# Patient Record
Sex: Male | Born: 1990 | Race: Black or African American | Hispanic: No | Marital: Married | Smoking: Current every day smoker
Health system: Southern US, Community
[De-identification: ages and names within clinical notes are randomized; demographics above are authoritative.]

## PROBLEM LIST (undated history)

## (undated) DIAGNOSIS — Y249XXA Unspecified firearm discharge, undetermined intent, initial encounter: Secondary | ICD-10-CM

## (undated) DIAGNOSIS — G610 Guillain-Barre syndrome: Secondary | ICD-10-CM

---
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
## 2008-10-26 IMAGING — CT CT HEAD W/O CM
1 series · 16 of 30 positions shown, 20 images · IV contrast (agent unspecified)
Comparison: none

HISTORY: Headache

CT HEAD WITHOUT CONTRAST:
Routine noncontrast CT head compared to 11/09/2006
Normal ventricular morphology.
No midline shift or mass-effect.
Normal appearance of brain parenchyma.
No mass, hemorrhage, or infarct.
Mucosal thickening left maxillary sinus.
Remaining sinuses clear bones unremarkable.

[Series 2: headseq 4.8 h37s · axial · 0.46mm/px · z∈[+93,+228]mm · 16 of 30 slices shown, 20 images]
[im 2/30  brain]
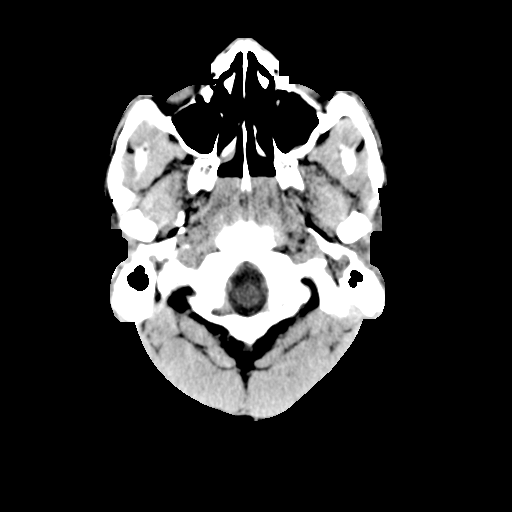
[im 2/30  bone]
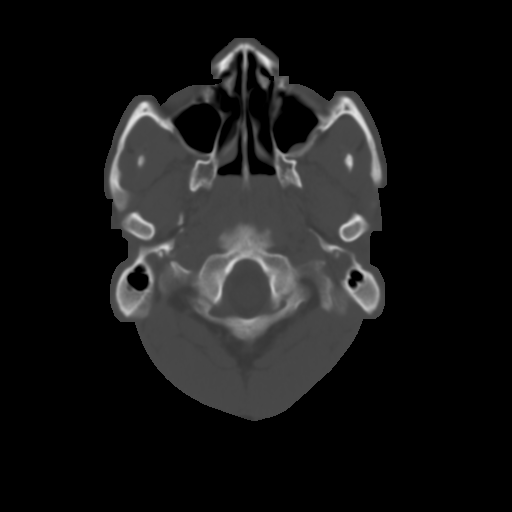
[im 4/30  brain]
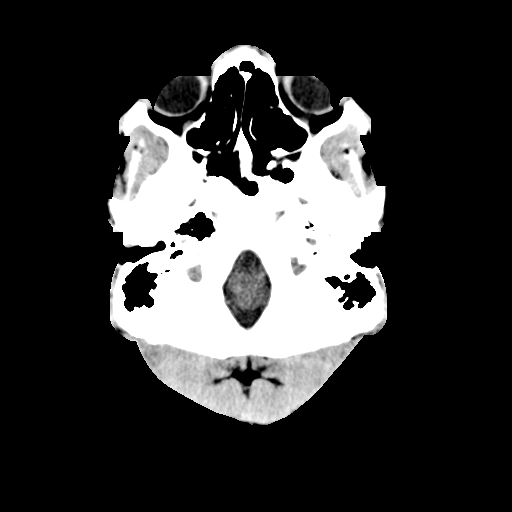
[im 6/30  brain]
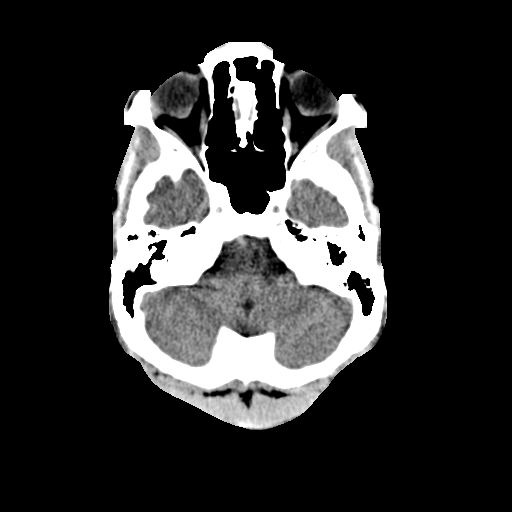
[im 8/30  brain]
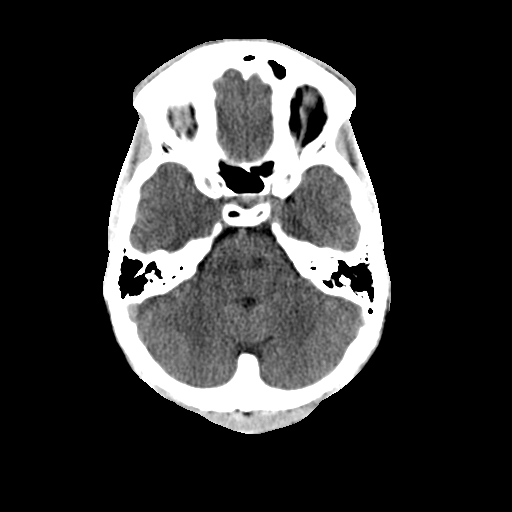
[im 9/30  brain]
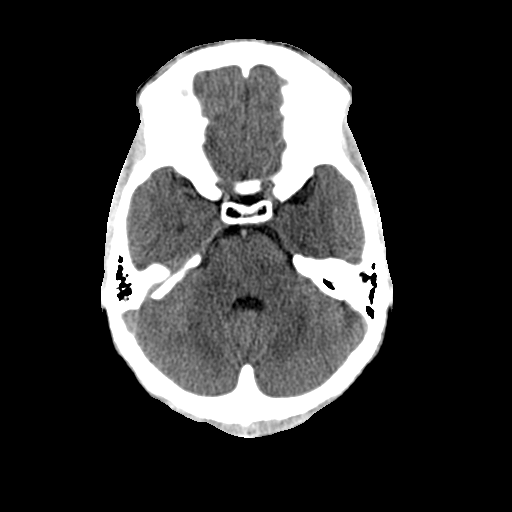
[im 9/30  bone]
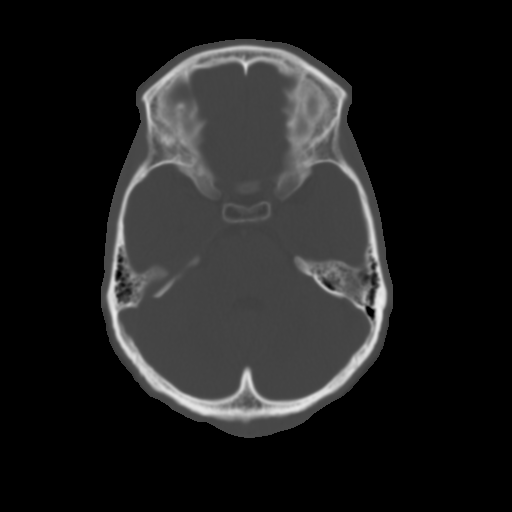
[im 11/30  brain]
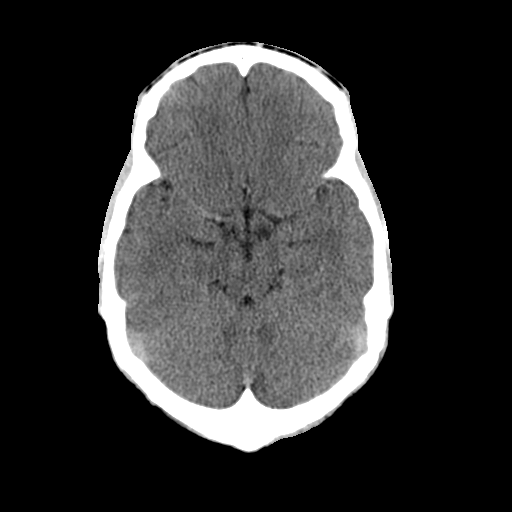
[im 13/30  brain]
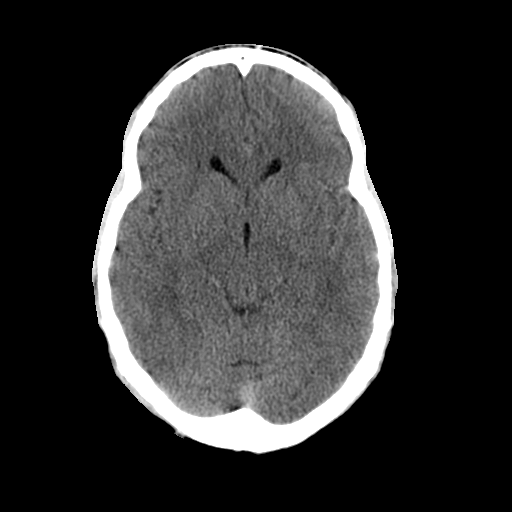
[im 15/30  brain]
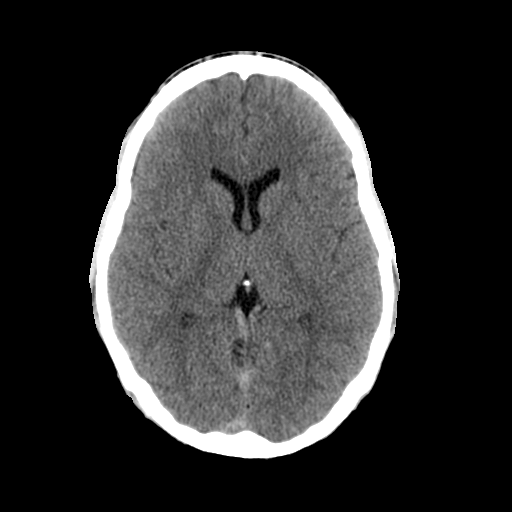
[im 16/30  brain]
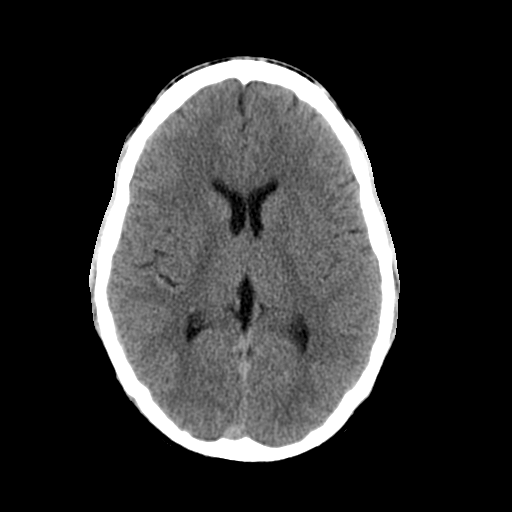
[im 16/30  bone]
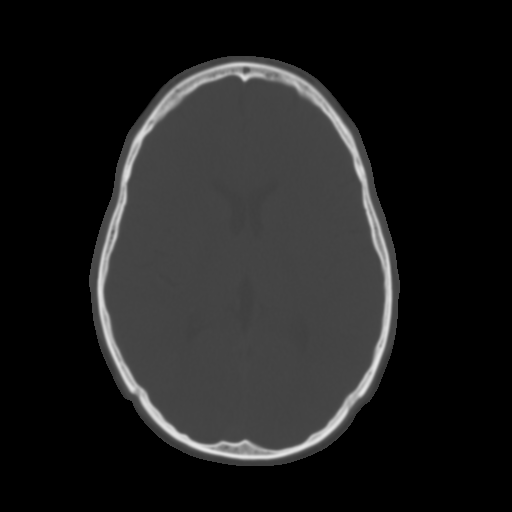
[im 18/30  brain]
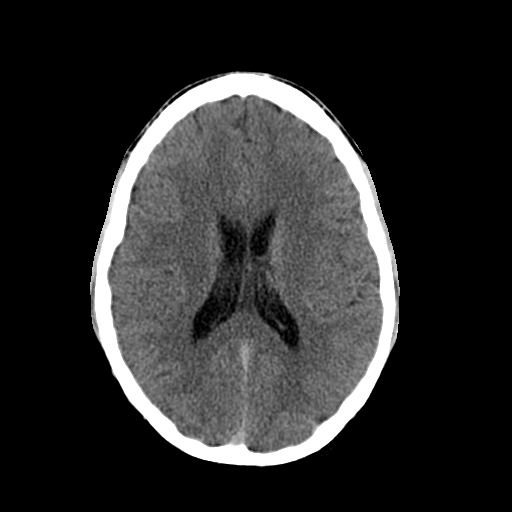
[im 20/30  brain]
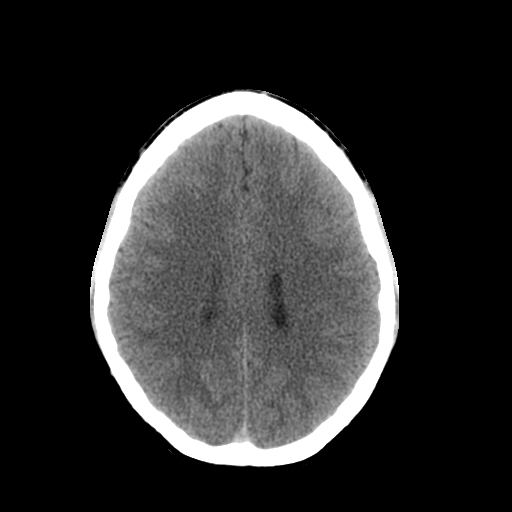
[im 22/30  brain]
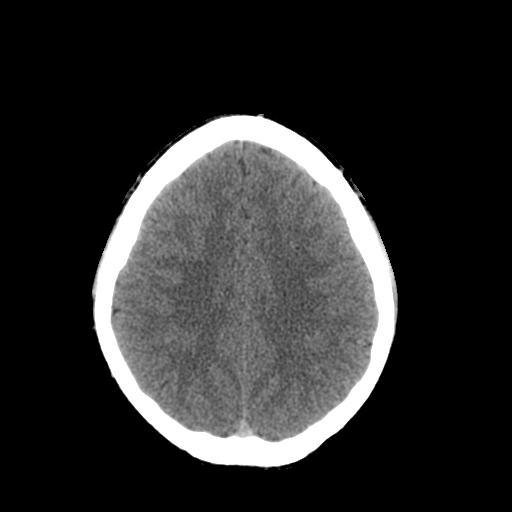
[im 23/30  brain]
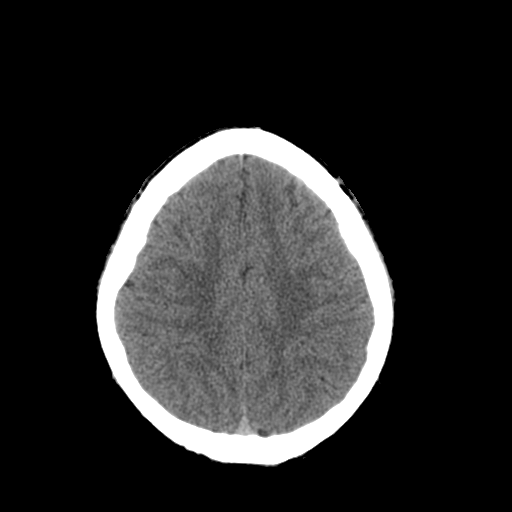
[im 23/30  bone]
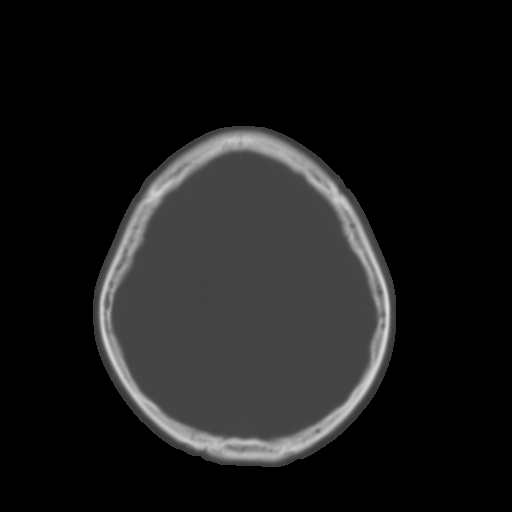
[im 25/30  brain]
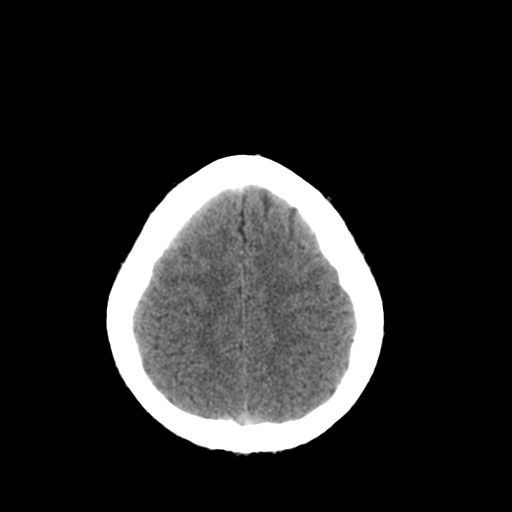
[im 27/30  brain]
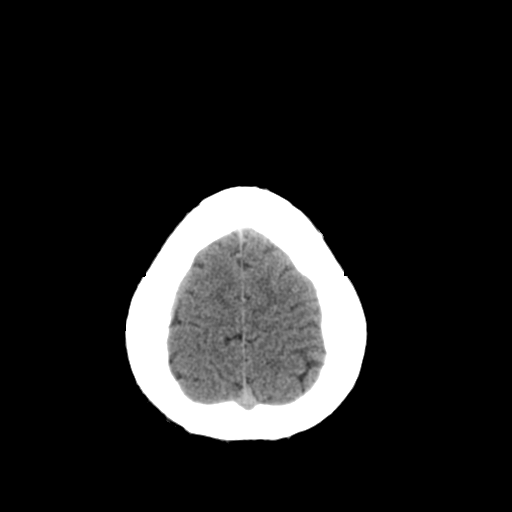
[im 29/30  brain]
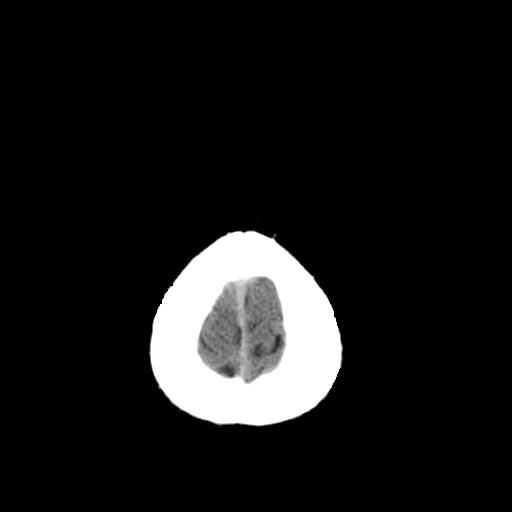

[16 of 30 positions shown; findings below may reference images not displayed]

IMPRESSION: No acute intracranial abnormalities.
If patient's symptoms persist, recommend followup MRI brain to evaluate.

## 2010-02-13 ENCOUNTER — Emergency Department (HOSPITAL_COMMUNITY)
Admission: EM | Admit: 2010-02-13 | Discharge: 2010-02-13 | Disposition: A | Discharge status: Home / Self Care | Payer: Self-pay | Attending: Emergency Medicine | Admitting: Emergency Medicine

## 2010-02-13 ENCOUNTER — Emergency Department (HOSPITAL_COMMUNITY): Admit: 2010-02-13 | Discharge: 2010-02-13 | Disposition: A | Discharge status: Home / Self Care | Payer: Self-pay

## 2010-02-13 DIAGNOSIS — S0180XA Unspecified open wound of other part of head, initial encounter: Secondary | ICD-10-CM | POA: Insufficient documentation

## 2010-02-13 DIAGNOSIS — T07XXXA Unspecified multiple injuries, initial encounter: Secondary | ICD-10-CM | POA: Insufficient documentation

## 2010-02-13 DIAGNOSIS — S022XXA Fracture of nasal bones, initial encounter for closed fracture: Secondary | ICD-10-CM | POA: Insufficient documentation

## 2010-02-13 DIAGNOSIS — M542 Cervicalgia: Secondary | ICD-10-CM | POA: Insufficient documentation

## 2010-02-13 DIAGNOSIS — Y92410 Unspecified street and highway as the place of occurrence of the external cause: Secondary | ICD-10-CM | POA: Insufficient documentation

## 2010-02-14 ENCOUNTER — Emergency Department (HOSPITAL_COMMUNITY)
Admission: EM | Admit: 2010-02-14 | Discharge: 2010-02-14 | Disposition: A | Discharge status: Home / Self Care | Payer: Self-pay | Attending: Emergency Medicine | Admitting: Emergency Medicine

## 2010-02-14 DIAGNOSIS — Y929 Unspecified place or not applicable: Secondary | ICD-10-CM | POA: Insufficient documentation

## 2010-02-14 DIAGNOSIS — M545 Low back pain, unspecified: Secondary | ICD-10-CM | POA: Insufficient documentation

## 2010-02-14 DIAGNOSIS — T148XXA Other injury of unspecified body region, initial encounter: Secondary | ICD-10-CM | POA: Insufficient documentation

## 2010-02-14 DIAGNOSIS — M25519 Pain in unspecified shoulder: Secondary | ICD-10-CM | POA: Insufficient documentation

## 2010-02-14 DIAGNOSIS — G610 Guillain-Barre syndrome: Secondary | ICD-10-CM | POA: Insufficient documentation

## 2010-02-14 DIAGNOSIS — J45909 Unspecified asthma, uncomplicated: Secondary | ICD-10-CM | POA: Insufficient documentation

## 2010-05-27 NOTE — H&P (Signed)
NAMEIBRAHIMA, William Bartlett               ACCOUNT NO.:  [id number]   MEDICAL RECORD NO.:  [id number]          PATIENT TYPE:  INP   LOCATION:  A332                          FACILITY:  [facility]   PHYSICIAN:  Gardiner Barefoot, MD    DATE OF BIRTH:  [birth year]   DATE OF ADMISSION:  02/25/2007  DATE OF DISCHARGE:  LH                              HISTORY & PHYSICAL   CHIEF COMPLAINT:  Weakness.   HISTORY OF PRESENT ILLNESS:  This is an 20 year old male who comes in  with 3 days of progressive weakness, starting from the lower  extremities.  He continued to have difficulty walking, some fatigue, and  dysarthria.  The patient recently had what was thought to be a sinusitis  and recently has been on azithromycin as well as doxycycline.  He was  seen in the emergency room by neurology, who does feel the patient has a  Guillain-Barre syndrome.   PAST MEDICAL HISTORY:  1. Bronchitis.  2. Eczema.   MEDICATIONS:  Vicodin p.r.n. for recent headache.   ALLERGIES:  AZITHROMYCIN causes lip swelling.   SOCIAL HISTORY:  Admits to smoking, denies drinking, occasional  marijuana.   FAMILY HISTORY:  Hypertension.   REVIEW OF SYSTEMS:  Negative except for history of present illness.   PHYSICAL EXAMINATION:  VITAL SIGNS:  Temperature is 98.7, pulse is 94,  respirations 15, blood pressure is 136/83, and O2 sat is 99%.  GENERAL:  The patient is awake, alert, and oriented x3, appears in no  acute distress.  CARDIOVASCULAR:  Regular rate and rhythm.  No murmurs, rubs, or gallops.  LUNGS:  Clear to auscultation bilaterally.  ABDOMEN:  Soft, nontender, nondistended.  Positive bowel sounds.  No  hepatosplenomegaly.  EXTREMITIES:  No cyanosis, clubbing, or edema.  NEUROLOGIC:  Weakness lower extremities.  He is able to move them.  Dysarthria.   No labs.   ASSESSMENT/PLAN:  Guillian-Barre.  Appreciate neurology input.  Neurology is starting him on immunoglobulin therapy as well as doing an  extensive workup  including an MRI and blood tests.  The patient is going  to get a swallow evaluation and physical therapy and occupational  therapy.      Gardiner Barefoot, MD  Electronically Signed     RWC/MEDQ  D:  02/25/2007  T:  02/25/2007  Job:  [id number]

## 2010-05-27 NOTE — Group Therapy Note (Signed)
NAMEKAMIR, SELOVER NO.:  [id number]   MEDICAL RECORD NO.:  [id number]          PATIENT TYPE:  INP   LOCATION:  A332                          FACILITY:  [facility]   PHYSICIAN:  Gardiner Barefoot, MD    DATE OF BIRTH:  25-Jul-1990   DATE OF PROCEDURE:  DATE OF DISCHARGE:                                 PROGRESS NOTE   DICTATION ENDED AT THIS POINT      Gardiner Barefoot, MD  Electronically Signed     RWC/MEDQ  D:  02/26/2007  T:  02/27/2007  Job:  [id number]

## 2010-05-27 NOTE — Group Therapy Note (Signed)
William Bartlett, William Bartlett               ACCOUNT NO.:  [id number]   MEDICAL RECORD NO.:  [id number]          PATIENT TYPE:  INP   LOCATION:  A332                          FACILITY:  [facility]   PHYSICIAN:  Gardiner Barefoot, MD    DATE OF BIRTH:  [birth year]   DATE OF PROCEDURE:  02/26/2007  DATE OF DISCHARGE:                                 PROGRESS NOTE   SUBJECTIVE:  The patient reports some mild improvement in symptoms and  is able to walk around.   OBJECTIVE:  VITAL SIGNS:  Temperature is 98.1, pulse is 105,  respirations 20, blood pressure 132/72.  GENERAL:  The patient is awake and alert and appears in no acute  distress.  CARDIOVASCULAR:  Tachycardic with regular rhythm.  No murmurs, rubs or  gallops.  LUNGS:  Clear to auscultation bilaterally.  ABDOMEN:  Soft, nontender, nondistended, positive bowel sounds, no  hepatosplenomegaly.  EXTREMITIES:  No cyanosis, clubbing or edema.  NEUROLOGIC:  The patient is able to ambulate with assistance although is  unsteady.  He does move all extremities.  He is alert.   Laboratory data:  Sedimentation rate is 7.  TSH is 1.25, free T4 is  1.22.  B12 is within normal limits.  CBC and CMP within normal limits.  MRI with no acute intracranial findings.  Some minimal left mastoid  effusion and mucosa thickening of the left maxillary sinus.   ASSESSMENT AND PLAN:  An 20 year old with Guillain-Barre syndrome.   Neurologic:  The patient's symptoms are consistent with a degree of  Guillain-Barre, and we will continue to monitor the patient.  He is  receiving IVIG per neurology.  Had some mild improvement since  yesterday.  Appreciate neurology input.  We will continue to monitor and  ambulate and have physical therapy work with him.  He is scheduled to  get a swallow evaluation.      Gardiner Barefoot, MD  Electronically Signed     RWC/MEDQ  D:  02/26/2007  T:  02/27/2007  Job:  [id number]

## 2010-05-27 NOTE — Discharge Summary (Signed)
William Bartlett, William Bartlett               ACCOUNT NO.:  [id number]   MEDICAL RECORD NO.:  [id number]          PATIENT TYPE:  INP   LOCATION:  A332                          FACILITY:  [facility]   PHYSICIAN:  Skeet Latch, DO    DATE OF BIRTH:  1990-02-03   DATE OF ADMISSION:  02/25/2007  DATE OF DISCHARGE:  LH                               DISCHARGE SUMMARY   ADMISSION DIAGNOSIS:  Guillain-Barre syndrome, weakness.   DISCHARGE DIAGNOSIS:  Guillain-Barre syndrome, weakness.   HISTORY OF PRESENT ILLNESS:  Please see H and P done on February 25, 2007 for details.  This is an 20 year old African American male who  presented to the ER with headache and sinus symptoms.  He was diagnosed  with sinusitis and started on Zithromax and proceeded to develop facial  swelling, therefore his medication was discontinued.  He was then  started on Doxycycline and continued to have worsening symptoms, began  to have difficulty walking, talking and eating.  He went to the  emergency room for evaluation.  He continued to have headaches.  He was  having extreme difficulty walking, stumbling and developed dysarthria,  problems swallowing.  He was seen and admitted for a swallowing  evaluation.  He was kept NPO.  He had a head CT performed that showed no  intracranial abnormalities.  The patient did have a CT of his head done  on February 18, 2007 that showed no acute intracranial abnormalities.  Subsequent MR of his brain was performed which showed prominent adenoid  tissue, showed minimal mucosal thickening of left maxillary sinus, also  showed tiny left mastoid effusion.  Upon being admitted to the hospital  patient had a neurologic consult and it was believe that patient had  Guillain-Barre syndrome.  This was secondary to his gait weakness,  areflexia, dysarthria, dysphagia and facial plegia.  Patient was placed  on DVT and PE prophylaxis.  Patient was started on IV immunoglobulins.  Patient received  immunoglobulins for approximately five days.  Patient  continued to have physical therapy on a daily basis while in the  hospital.  Patient slowly improved but still had muscle weakness that  required inpatient treatment at this time.  His vitals on discharge:  Temperature 98.3, pulse 99, respirations 16, blood pressure 129/72.  Labs:  Showed sodium 135, potassium 3.6, chloride 101, CO 29, glucose  94, BUN 7, creatinine 0.79, white count 6.9, hemoglobin 14.6, hematocrit  41.6, platelet count 273.   DISCHARGE MEDICATIONS:  None.   CONDITION ON DISCHARGE:  Stable.   DISPOSITION:  Patient will be discharged to [facility] inpatient rehab  facility.   DISCHARGE INSTRUCTIONS:  Patient to have a soft diet with thin liquids.  Activity is to be addressed in patient rehab facility according to  symptoms.  Patient is to have a followup with his primary care physician  after he is discharged from the rehab facility.      Skeet Latch, DO  Electronically Signed     SM/MEDQ  D:  03/03/2007  T:  03/03/2007  Job:  [phone]

## 2010-05-27 NOTE — Assessment & Plan Note (Signed)
William Bartlett returns to the clinic today accompanied by his mother.  The  patient is an 20 year old male with an unremarkable past medical history  apart from recent sinus infection, for which he was placed on Zithromax.  He developed facial swelling and it was felt that he may have had an  antibiotic reaction and the antibiotic was changed to doxycycline but he  still had progressive facial swelling along with progressive weakness,  slurred speech and gait disorder.  He was admitted to [facility] February 25, 2007 with suspected Guillain-Barre' syndrome.  Cranial CT scan was negative.  MRI study was negative for acute changes.  He was started on intravenous immunoglobulin for five days.  He managed  to be stabilized and was moved to the rehabilitation unit at [facility] March 04, 2007.  He remained there through discharge  March 11, 2007.   Since discharge, the patient has been at home.  He had been receiving  home health therapies but that has discontinued at this point.  He is  due to followup with Dr. Gerilyn Pilgrim, his neurologist, for evaluation of  chronic migraines.  He continues to use oxycodone at the present time,  prescribed through this office.   The patient has not returned to school where he is an 11th grader.  He  previously was on suspension but the suspension was about to end at the  time that he came down with Guillain-Barre' syndrome.  He has not  returned to the regular school at [employer] at this point.   REVIEW OF SYSTEMS:  Noncontributory.   MEDICATIONS:  Oxycodone 5/325 one tablet t.i.d. p.r.n.   PHYSICAL EXAMINATION:  GENERAL:  Well-appearing, fit, adult male in no  acute discomfort.  VITAL SIGNS:  Blood pressure 127/67 with pulse of 84, respiratory rate  18 and oxygen saturation 100% on room air.   He has 5/5 strength throughout the bilateral upper and lower  extremities.  Bulk and tone are normal.  Reflexes are 2+ and  symmetrical.   Examination of the facial strength shows slightly weak eye  closure, especially on the right.  Extraocular muscles were intact but  with slight facial weakness on the right while smiling.  Tongue was in  the midline.  Sensation was intact to light touch throughout the  bilateral face.   IMPRESSION:  1. Status post Guillain-Barre' syndrome.  2. Dysarthria and dysphasia, resolved.  3. History of tobacco abuse.  4. History of migraine headaches, presently on oxycodone.   In the office today we did not need to refill his pain medicines.  If he  continues to need that pain medicine we will need to see him in followup  in approximately 2 to 4 month's time.  In the meantime, he will be  following up with his neurologist, Dr. Gerilyn Pilgrim for evaluation of his  chronic migraine headaches.  We will plan on seeing him in followup on  an as-needed basis and on a periodic basis if he continues to need  narcotics through this office.  At this point, the patient can return to  his regular schooling without modifications or restrictions.  His Mother  will be in charge of trying to get him back into the [place] school  district.           ______________________________  Ellwood Dense, M.D.    DC/MedQ  D:  04/20/2007 10:08:05  T:  04/20/2007 11:12:43  Job #:  [id number]

## 2010-05-27 NOTE — H&P (Signed)
NAMEEMMITTE, SURGEON NO.:  [id number]   MEDICAL RECORD NO.:  [id number]          PATIENT TYPE:  IPS   LOCATION:  4147                         FACILITY:  [facility]   PHYSICIAN:  Ellwood Dense, M.D.   DATE OF BIRTH:  12/27/1990   DATE OF ADMISSION:  03/04/2007  DATE OF DISCHARGE:                              HISTORY & PHYSICAL   PRIMARY CARE PHYSICIAN:  Skeet Latch, DO, at [facility]   HISTORY OF PRESENT ILLNESS:  Mr. William Bartlett is an 20 year old previously-  healthy African American male with a with an unremarkable past medical  history.  He does have a history of recent sinus infection and was  placed on Zithromax.  Initially the patient developed facial swelling  and it was felt to be a reaction to the Zithromax, and that was changed  to doxycycline.  His facial swelling and weakness and dysarthria  worsened along with a gait disorder.  He was admitted to [facility] February 25, 2007, was suspected Guillain-Barre  syndrome.  Initial cranial CT was negative and MRI study was negative  except for minimal mucosal thickening of the left maxillary sinus  consistent with sinusitis.   The patient was started on a 5-day course of IV immunoglobulin.  He was  placed on subcu Lovenox for DVT prophylaxis.  A speech therapy  evaluation showed that he could tolerate D3 with thin liquids.  His  mobility has continued to a improved and he needs min to mod assist for  gait at the present time.   The patient was evaluated by the rehabilitation physicians and felt to  be an appropriate candidate for inpatient rehabilitation.   REVIEW OF SYSTEMS:  Positive for headache.   PAST MEDICAL HISTORY:  1. Bronchitis.  2. Eczema.   FAMILY HISTORY:  Positive for hypertension.   SOCIAL HISTORY:  The patient lives with his mother, who works outside  the home.  The patient was recently suspended from high school in  [place] but was in the process  of trying to return to school.  His  family can assist as needed.  He has a history of tobacco use but denies  alcohol usage.  His does have a positive history for marijuana usage.   SECTION FUNCTIONAL HISTORY PRIOR TO ADMISSION:  Independent and  attending school until just recently.   ALLERGIES:  No known drug allergies.   MEDICATIONS PRIOR TO ADMISSION:  Vicodin p.r.n. for headaches.   LABORATORY:  Recent hemoglobin was 14.6 with hematocrit 41.0, platelet  count 263,000, white count of 6.9.  Recent sodium was 135, potassium  3.6, chloride 101, bicarbonate 29, BUN 7, creatinine 0.7, with TSH 1.258  and erythrocyte sedimentation rate of 7.   PHYSICAL EXAM:  He is a well-appearing adult teen lying in bed with his  mother present and uncle.  Blood pressure is 107/59 with a pulse of 103, respiratory rate 18 and  temperature 98.0.  HEENT: Normocephalic, nontraumatic.  CARDIOVASCULAR:  Regular rate and rhythm, S1 and S2 without murmurs.  ABDOMEN:  Soft, nontender, with positive bowel sounds.  LUNGS:  Clear to auscultation bilaterally.  NEUROLOGIC:  Alert and oriented x3.  Cranial nerve exam showed decreased  facial strength bilaterally.  Tongue was in the midline and he was able  to protrude the tongue but he had poor articulation of his words and  poor chewing inability.  Sensation was decreased to light touch on the  bilateral face.  The upper extremity exam showed 4+/5 strength throughout.  Bulk and tone  were normal.  The patient did complain of tingling of his bilateral  upper extremities.  Lower extremity exam showed hip flexion, knee  extension and ankle dorsiflexion at 3+ to 4-/5.  Sensation was decreased  to light touch in his distal lower extremities with complaints of  tingling in his ankles and feet.   IMPRESSION:  1. Guillain-Barre syndrome, status post IVIG for 5 days.  2. Dysarthria and dysphagia related to #1.   Presently the patient has deficits in ADLs, transfers  and ambulation  along with swallowing and speech related to the above-noted Guillain-  Barre syndrome.   PLAN:  1. Admit to rehabilitation with daily therapies to include physical      therapy for range of motion, strengthening, bed mobility,      transfers, pre gait training, gait training and equipment      evaluation.  2. Occupational therapy for range of motion, strengthening, ADLs,      cognitive/perceptual training, splinting and equipment evaluation.  3. Rehab nursing for skin care, wound care and bowel and bladder      training as necessary.  4. Speech therapy for oral motor exercises, communication aids, Vital      Stim, and evaluation of swallow.  5. Case management to assess home environment, assist with discharge      planning and arrange for appropriate follow-up care.  6. Social work to assess family and social support, consultation      regarding disability issues and assist in discharge planning.  7. Continue D3 thin liquid diet.  8. Check admission labs including CBC and CMET in a.m. March 07, 2007.  9. Thigh-high TEDS on q.a.m. and remove at nightly.  10.Oxycodone 5 mg one to two tablets q.4h. p.r.n. for pain.  11.Sorbitol 30 mL p.o. q.12 h. p.r.n.  12.Dulcolax suppository one per rectum daily p.r.n.  13.Routine turning to prevent skin breakdown.  14.Subcutaneous Lovenox 40 mg daily.  15.Senokot-S 2 tablets p.o. nightly.  16.Discontinue IV fluids if not already done.  17.Multivitamin one p.o. daily.  18.Eye patches or eye mask on at bedtime to eliminate corneal      abrasions.  19.Normal saline eye drops used p.r.n.   PROGNOSIS:  Good.   ESTIMATED LENGTH OF STAY:  Approximately 2-3 weeks.   GOALS:  Modified independent, ADLs; and standby assist, transfers and  ambulation; and modified independent, wheelchair mobility, with  advancement of diet as possible.           ______________________________  Ellwood Dense, M.D.     DC/MEDQ  D:   03/04/2007  T:  03/05/2007  Job:  [id number]

## 2010-05-27 NOTE — Discharge Summary (Signed)
William Bartlett, William Bartlett NO.:  [id number]   MEDICAL RECORD NO.:  [id number]          PATIENT TYPE:  IPS   LOCATION:  4147                         FACILITY:  [facility]   PHYSICIAN:  Ellwood Dense, M.D.   DATE OF BIRTH:  1990/03/01   DATE OF ADMISSION:  03/04/2007  DATE OF DISCHARGE:  03/11/2007                               DISCHARGE SUMMARY   DISCHARGE DIAGNOSES:  1. Guillain-Barr syndrome.  2. Dysarthria.  3. Dysphagia, resolved.  4. History of tobacco abuse.  5. Subcutaneous Lovenox for deep vein thrombosis prophylaxis.   This is an 19 year old male, unremarkable past history, with recent  sinus infection, placed on Zithromax.  Developed facial swelling,  antibiotic discontinued and changed to doxycycline but still with  progressive facial swelling, weakness and slurred speech with gait  disorder.  Admitted [facility] 13 with  suspect Guillain-Barr syndrome.  Cranial CT scan negative.  MRI  negative for acute changes.  Started on intravenous immunoglobulin x5  days.  Placed on subcutaneous Lovenox for deep vein thrombosis  prophylaxis.  Maintained on a mechanical soft diet.  He was minimum to  moderate assist for his ambulation.   PAST MEDICAL HISTORY:  Negative.   He does have a history of tobacco use.  No alcohol.  Occasional  marijuana.   SOCIAL HISTORY:  Lives with his mother.  Recently suspended from high  school.  Family can assist as needed on discharge.   ALLERGIES:  None.   MEDICATIONS PRIOR TO ADMISSION:  Vicodin as needed for headache.   REHABILITATION HOSPITAL COURSE:  The patient was admitted to inpatient  rehab services with therapies initiated on a 3-hour daily basis  consisting of physical therapy, occupational therapy, speech therapy and  rehabilitation nursing.  The following issues were addressed during the  patient's rehabilitation stay.  Pertaining to [name] is Guillain-  Barr syndrome, he had  completed a 5-day course of IVIG.  He did have  some dysarthric speech, which continued to greatly improve followed by  speech therapy, as well as mild dysphagia with diet advanced to a  regular diet, which he tolerated well.  He remained on subcutaneous  Lovenox for deep vein thrombosis prophylaxis throughout his rehab  course.  He still had some decreased sensation to the distal  extremities.  Overall functionally he was ambulating extended distances  with a rolling walker, supervision for wheelchair mobility, minimal  assistance for transfer, supervision to minimal assist for activities of  daily living.  He was advised no driving on discharge.  He did have a  history of tobacco use.  It was discussed at length and noted the need  for cessation of any nicotine products.  It was questionable if he would  be compliant with these requests.  He had no bowel or bladder  disturbances.   Latest labs showed a hemoglobin 12.7, hematocrit 35.6, platelet 259,000.  Sodium 134, potassium 3.9, BUN 16, creatinine 0.8.   Discharge medications at the time of dictation included:  1. Multivitamin daily.  2. Oxycodone immediate release 5 mg one or two  tablets every 4 hours      as needed pain, dispense 90 tablets.   His diet was regular.   SPECIAL INSTRUCTIONS:  Follow up with Dr. Ellwood Dense at the  outpatient rehab service office as advised.  Ongoing therapies as  recommended per rehab services.      Mariam Dollar, P.A.    ______________________________  Ellwood Dense, M.D.    DA/MEDQ  D:  03/10/2007  T:  03/10/2007  Job:  [id number]

## 2010-05-27 NOTE — Consult Note (Signed)
NAMETREVONN, William Bartlett               ACCOUNT NO.:  [id number]   MEDICAL RECORD NO.:  [id number]          PATIENT TYPE:  INP   LOCATION:  A332                          FACILITY:  [facility]   PHYSICIAN:  Kofi A. Gerilyn Pilgrim, M.D. DATE OF BIRTH:  [birth year]   DATE OF CONSULTATION:  DATE OF DISCHARGE:                                 CONSULTATION   REASON FOR CONSULTATION:  Headache and gait problems.   [name] who presented to the emergency  room a few days ago with headache and sinus symptoms.  He was diagnosed  as having sinusitis and started on Zithromax.  The patient apparently  developed facial swelling and therefore this medication was  discontinued.  He was started on doxycycline but continued to have  worsening symptoms with difficulty talking, not eating, and stumbling.  He presented to the emergency room for further evaluation.  He continued  to have some headaches, although the intensity is 3/10.  His main  complaint seems to be the difficulty walking around and stumbling around  associated with dysarthria and problems swallowing.   PAST MEDICAL HISTORY:  1. Chronic bronchitis.  2. Eczema.   ADMISSION MEDICATIONS:  Doxycycline.   FAMILY HISTORY:  Hypertension.   PAST SURGICAL HISTORY:  None.   SOCIAL HISTORY:  Smokes cigarettes.  No alcohol use.  He does admit to  using cannabis.   REVIEW OF SYSTEMS:  As stated in the history of present illness.  Additionally, the patient does report having tingling involving the toes  in his feet.  No fevers are reported.  No rash.   PHYSICAL EXAMINATION:  VITAL SIGNS:  Temperature 98.2, blood pressure  128/80, respirations 16, pulse ox 95%.  HEENT:  Neck is supple.  Head is normocephalic and atraumatic.  ABDOMEN:  Soft.  EXTREMITIES:  No significant edema.  MENTATION:  The patient is awake and alert.  He follows commands well.  He has severe dysarthria.  CRANIAL NERVES:  Most prominent finding is a  complete facial diplegia.  Pupils are reactive.  Extraocular movements are intact.  No nystagmus is  seen.  Tongue is midline.  MOTOR:  Shows good strength in the upper extremities.  Bulk and tone are  also normal.  There is 4/5 weakness involving the legs with normal bulk  and tone.  COORDINATION:  Shows no dysmetria.  REFLEXES:  Are absent throughout even with augmentation, except for the  triceps.  Plantar reflexes downgoing.  SENSATION:  Normal to light touch.   Head CT scan is reviewed and it shows no intracranial abnormalities.  No  white matter tract lesions and nothing acute is observed.   IMPRESSION:  Acute onset of ataxic gait, leg weakness, areflexia,  dysarthria, dysphagia, and facial diplegia.   I believe the most likely diagnosis is acute poly-radicular neuritis,  i.e., Guillain-Barre syndrome.  Other potential differential diagnoses  are probably less likely including thyroid myopathy, other forms of  myopathy, myasthenia gravis, and intracranial processes are probably  less likely.   RECOMMENDATIONS:  1. Swallowing evaluation and speech evaluation. He probably should be  kept NPO until this can be done, also physical and occupational      therapy.  2. Blood testing to rule out other causes.  This should include CPK,      thyroid function test, acetylcholine receptor antibodies, routine      labs, homocystine level, and vitamin B12 level, also a sed rate and      ANA may be good to obtain.  3. MRI of the brain.  4. DVT prophylaxis.  5. I think we will go ahead and treat him with immunoglobulins.  We      will use 400 mg/kg for 5 days.   Thanks for this consultation.      Kofi A. Gerilyn Pilgrim, M.D.  Electronically Signed     KAD/MEDQ  D:  02/25/2007  T:  02/25/2007  Job:  [id number]

## 2010-10-03 LAB — BASIC METABOLIC PANEL
CO2: 29
Calcium: 9.7
Chloride: 101
GFR calc Af Amer: >60
Sodium: 135

## 2010-10-03 LAB — DIFFERENTIAL
Lymphocytes Relative: 41
Lymphs Abs: 2.8
Neutrophils Relative %: 48

## 2010-10-03 LAB — CBC
Hemoglobin: 14.6
MCHC: 35.2
MCV: 81.8
RBC: 5.09

## 2010-10-03 LAB — VITAMIN B12: Vitamin B-12: 778 (ref 211–911)

## 2010-10-03 LAB — SEDIMENTATION RATE: Sed Rate: 7

## 2010-10-03 LAB — COMPREHENSIVE METABOLIC PANEL
CO2: 29
Calcium: 9.9
Creatinine, Ser: 0.86
GFR calc non Af Amer: >60
Glucose, Bld: 111 — ABNORMAL HIGH

## 2010-10-03 LAB — MYASTHENIA GRAVIS PANEL 2: Acetylcholine Receptor Ab: 0 nmol/L (ref 0.0–0.4)

## 2010-10-06 LAB — COMPREHENSIVE METABOLIC PANEL
ALT: 201 — ABNORMAL HIGH
ALT: 295 — ABNORMAL HIGH
AST: 130 — ABNORMAL HIGH
Albumin: 3.2 — ABNORMAL LOW
CO2: 27
Calcium: 9.6
Calcium: 9.8
Chloride: 100
Creatinine, Ser: 0.78
GFR calc Af Amer: >60
GFR calc non Af Amer: >60
Glucose, Bld: 117 — ABNORMAL HIGH
Sodium: 134 — ABNORMAL LOW
Sodium: 136
Total Bilirubin: 1.9 — ABNORMAL HIGH
Total Protein: 8

## 2010-10-06 LAB — CBC
Hemoglobin: 12.7 — ABNORMAL LOW
MCHC: 35.8
MCV: 80.9
RBC: 4.4

## 2010-10-06 LAB — DIFFERENTIAL
Basophils Absolute: 0.2 — ABNORMAL HIGH
Basophils Relative: 3 — ABNORMAL HIGH
Eosinophils Absolute: 0.2
Lymphs Abs: 3.3
Neutrophils Relative %: 43

## 2013-10-24 ENCOUNTER — Emergency Department (HOSPITAL_COMMUNITY): Payer: Self-pay

## 2013-10-24 ENCOUNTER — Emergency Department (HOSPITAL_COMMUNITY)
Admission: EM | Admit: 2013-10-24 | Discharge: 2013-10-24 | Disposition: A | Discharge status: Home / Self Care | Payer: Self-pay | Attending: Emergency Medicine | Admitting: Emergency Medicine

## 2013-10-24 ENCOUNTER — Encounter (HOSPITAL_COMMUNITY): Payer: Self-pay | Admitting: Emergency Medicine

## 2013-10-24 DIAGNOSIS — S71102A Unspecified open wound, left thigh, initial encounter: Secondary | ICD-10-CM | POA: Insufficient documentation

## 2013-10-24 DIAGNOSIS — Y92410 Unspecified street and highway as the place of occurrence of the external cause: Secondary | ICD-10-CM | POA: Insufficient documentation

## 2013-10-24 DIAGNOSIS — W3400XA Accidental discharge from unspecified firearms or gun, initial encounter: Secondary | ICD-10-CM | POA: Insufficient documentation

## 2013-10-24 DIAGNOSIS — S71132A Puncture wound without foreign body, left thigh, initial encounter: Secondary | ICD-10-CM

## 2013-10-24 DIAGNOSIS — Y9301 Activity, walking, marching and hiking: Secondary | ICD-10-CM | POA: Insufficient documentation

## 2013-10-24 DIAGNOSIS — Z23 Encounter for immunization: Secondary | ICD-10-CM | POA: Insufficient documentation

## 2013-10-24 DIAGNOSIS — Z720 Tobacco use: Secondary | ICD-10-CM | POA: Insufficient documentation

## 2013-10-24 MED ORDER — SODIUM CHLORIDE 0.9 % IV BOLUS (SEPSIS)
500.0000 mL | Freq: Once | INTRAVENOUS | Status: AC
  Administered 2013-10-24: 500 mL via INTRAVENOUS

## 2013-10-24 MED ORDER — TETANUS-DIPHTH-ACELL PERTUSSIS 5-2.5-18.5 LF-MCG/0.5 IM SUSP
0.5000 mL | Freq: Once | INTRAMUSCULAR | Status: AC
  Administered 2013-10-24: 0.5 mL via INTRAMUSCULAR
  Filled 2013-10-24: qty 0.5

## 2013-10-24 MED ORDER — CEFAZOLIN SODIUM 1-5 GM-% IV SOLN
1.0000 g | Freq: Once | INTRAVENOUS | Status: AC
  Administered 2013-10-24: 1 g via INTRAVENOUS
  Filled 2013-10-24: qty 50

## 2013-10-24 MED ORDER — CEPHALEXIN 500 MG PO CAPS: 500.0000 mg | ORAL_CAPSULE | Freq: Four times a day (QID) | ORAL | Status: DC

## 2013-10-24 MED ORDER — OXYCODONE-ACETAMINOPHEN 5-325 MG PO TABS: 2.0000 | ORAL_TABLET | ORAL | Status: DC | PRN
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
## 2013-10-24 NOTE — Discharge Instructions (Signed)
Elevate legs. Keep wound clean. Daily dressing changes. Antibiotic and pain medicine. See orthopedic surgeon next week.  Phone number given

## 2013-10-24 NOTE — ED Notes (Signed)
[place] [employer] onsite to take report from pt.

## 2016-04-19 ENCOUNTER — Encounter (HOSPITAL_COMMUNITY): Payer: Self-pay | Admitting: Emergency Medicine

## 2016-04-19 ENCOUNTER — Emergency Department (HOSPITAL_COMMUNITY)
Admission: EM | Admit: 2016-04-19 | Discharge: 2016-04-19 | Disposition: A | Discharge status: Home / Self Care | Payer: Self-pay | Attending: Emergency Medicine | Admitting: Emergency Medicine

## 2016-04-19 DIAGNOSIS — F17210 Nicotine dependence, cigarettes, uncomplicated: Secondary | ICD-10-CM | POA: Insufficient documentation

## 2016-04-19 DIAGNOSIS — K0381 Cracked tooth: Secondary | ICD-10-CM | POA: Insufficient documentation

## 2016-04-19 DIAGNOSIS — Z79899 Other long term (current) drug therapy: Secondary | ICD-10-CM | POA: Insufficient documentation

## 2016-04-19 DIAGNOSIS — K0889 Other specified disorders of teeth and supporting structures: Secondary | ICD-10-CM

## 2016-04-19 DIAGNOSIS — K047 Periapical abscess without sinus: Secondary | ICD-10-CM | POA: Insufficient documentation

## 2016-04-19 MED ORDER — ACETAMINOPHEN 500 MG PO TABS
1000.0000 mg | ORAL_TABLET | Freq: Once | ORAL | Status: AC
  Administered 2016-04-19: 1000 mg via ORAL
  Filled 2016-04-19: qty 2

## 2016-04-19 MED ORDER — PENICILLIN V POTASSIUM 500 MG PO TABS: 500.0000 mg | ORAL_TABLET | Freq: Three times a day (TID) | ORAL | 0 refills | Status: AC

## 2016-04-19 MED ORDER — BUPIVACAINE HCL (PF) 0.25 % IJ SOLN
INTRAMUSCULAR | Status: AC
  Filled 2016-04-19: qty 30

## 2016-04-19 MED ORDER — IBUPROFEN 400 MG PO TABS
600.0000 mg | ORAL_TABLET | Freq: Once | ORAL | Status: AC
  Administered 2016-04-19: 600 mg via ORAL
  Filled 2016-04-19: qty 2

## 2016-04-19 MED ORDER — BUPIVACAINE HCL 0.25 % IJ SOLN
5.0000 mL | Freq: Once | INTRAMUSCULAR | Status: AC
  Administered 2016-04-19: 5 mL

## 2016-04-19 NOTE — ED Notes (Signed)
PA in to block pt for complaint of dental pain

## 2016-04-19 NOTE — Discharge Instructions (Signed)
You were found to have a dental abscess today which is causing your pain. This needs to be treated by a dentist. Please contact the free clinic of [facility] and make and appointment to be evaluated by a dentist within the next 1-2 days for a possible extraction of your tooth. You have a prescription for antibiotics, please take these as prescribed until completed. You will continue to have pain until the infection is treated and the broken tooth is repaired or extracted. Please take 1000 mg of Tylenol plus a 600 mg of ibuprofen every 6-8 hours for the pain. You may also go to the pharmacy a grocery store and purchase an over-the-counter numbing gel that you can apply over your painful tooth. Monitor for signs of worsening infection including facial swelling, yellow discharge, bleeding, difficulty or unable to open or close your jaw, stiff neck, fever, drooling. Return to the emergency department if you notice the symptoms.  Please see attached pamphlet with information on please of that provide medical, dental and pharmacy care for patients without medical insurance.

## 2016-04-19 NOTE — ED Provider Notes (Signed)
[facility] DEPT Provider Note   CSN: [id number] Arrival date & time: 04/19/16  0957     History   Chief Complaint Chief Complaint  Patient presents with  . Dental Pain    HPI William Bartlett is a 26 y.o. male with h/o tobacco use presents with right lower dental pain x 1 week.  No fever, facial swelling, trismus, n/v, neck pain or stiffness, drooling, changes in voice, sore throat.  No h/o DM. Has tried ibuprofen with minimal release.  HPI  History reviewed. No pertinent past medical history.  There are no active problems to display for this patient.   History reviewed. No pertinent surgical history.     Home Medications    Prior to Admission medications   Medication Sig Start Date End Date Taking? Authorizing Provider  cephALEXin (KEFLEX) 500 MG capsule Take 1 capsule (500 mg total) by mouth 4 (four) times daily. 10/24/13   Donnetta Hutching, MD  oxyCODONE-acetaminophen (PERCOCET) 5-325 MG per tablet Take 2 tablets by mouth every 4 (four) hours as needed. 10/24/13   Donnetta Hutching, MD  penicillin v potassium (VEETID) 500 MG tablet Take 1 tablet (500 mg total) by mouth 3 (three) times daily. 04/19/16 04/26/16  Liberty Handy, PA-C    Family History No family history on file.  Social History Social History  Substance Use Topics  . Smoking status: Current Every Day Smoker    Packs/day: 0.50    Years: 7.00    Types: Cigarettes  . Smokeless tobacco: Never Used  . Alcohol use 3.0 oz/week    5 Shots of liquor per week     Allergies   Pineapple   Review of Systems Review of Systems  Constitutional: Negative for fever.  HENT: Positive for dental problem. Negative for drooling, ear pain, facial swelling, sore throat and trouble swallowing.   Respiratory: Negative for choking.      Physical Exam Updated Vital Signs BP 115/83 (BP Location: Right Arm)   Pulse 70   Temp 97.8 F (36.6 C) (Oral)   Resp 18   Ht  (1.88 m)   Wt 84.4 kg   SpO2 100%   BMI 23.88  kg/m   Physical Exam  Constitutional: He is oriented to person, place, and time. He appears well-developed and well-nourished. No distress.  HENT:  Head: Normocephalic and atraumatic.  Mouth/Throat: Abnormal dentition. Dental abscesses present.  Poor dentition gingival inflammation throughout #32 tooth cracked, with tenderness and mild surrounding gingival erythema, edema, fluctuance. Scant amount of purulent discharge expressed with pressure.  No facial or anterior neck edema, erythema or erythema. No sublingual edema or tenderness.  Soft palate flat without tenderness.  No trismus.   No pooling of oral secretions.  Phonation normal, no hot potato voice.  Maxilla and mandible nontender. Mastoids without edema, erythema or tenderness.    Eyes: Conjunctivae and EOM are normal. No scleral icterus.  Neck: Normal range of motion.  Cardiovascular: Normal rate, regular rhythm, normal heart sounds and intact distal pulses.   No murmur heard. Pulmonary/Chest: Effort normal and breath sounds normal. He has no wheezes.  Musculoskeletal: Normal range of motion. He exhibits no deformity.  Lymphadenopathy:    He has no cervical adenopathy.  Neurological: He is alert and oriented to person, place, and time.  Skin: Skin is warm and dry. Capillary refill takes less than 2 seconds.  Psychiatric: He has a normal mood and affect. His behavior is normal. Judgment and thought content normal.  Nursing note  and vitals reviewed.    ED Treatments / Results  Labs (all labs ordered are listed, but only abnormal results are displayed) Labs Reviewed - No data to display  EKG  EKG Interpretation None       Radiology No results found.  Procedures .Nerve Block Date/Time: 04/19/2016 12:56 PM Performed by: Liberty Handy Authorized by: Liberty Handy   Consent:    Consent obtained:  Verbal   Consent given by:  Patient   Risks discussed:  Allergic reaction, pain and unsuccessful block    Alternatives discussed:  Alternative treatment Indications:    Indications:  Pain relief Location:    Body area:  Head   Head nerve blocked: inferior alveolar dental nerve.   Laterality:  Right Skin anesthesia (see MAR for exact dosages):    Skin anesthesia method:  Local infiltration Procedure details (see MAR for exact dosages):    Block needle gauge:  25 G   Anesthetic injected:  Bupivacaine 0.25% w/o epi   Injection procedure:  Anatomic landmarks identified, incremental injection, negative aspiration for blood and anatomic landmarks palpated   Paresthesia:  None Post-procedure details:    Outcome:  Anesthesia achieved   Patient tolerance of procedure:  Tolerated well, no immediate complications    (including critical care time)   Medications Ordered in ED Medications  bupivacaine (PF) (MARCAINE) 0.25 % injection (not administered)  acetaminophen (TYLENOL) tablet 1,000 mg (1,000 mg Oral Given 04/19/16 1053)  ibuprofen (ADVIL,MOTRIN) tablet 600 mg (600 mg Oral Given 04/19/16 1052)  bupivacaine (MARCAINE) 0.25 % (with pres) injection 5 mL (5 mLs Infiltration Given 04/19/16 1139)     Initial Impression / Assessment and Plan / ED Course  I have reviewed the triage vital signs and the nursing notes.  Pertinent labs & imaging results that were available during my care of the patient were reviewed by me and considered in my medical decision making (see chart for details).     Dental pain associated with broken tooth and dental abscess.  Patient afebrile, non toxic appearing, swallowing secretions well without hot potato voice. Exam unconcerning for Ludwig's angina or other deep tissue infection in neck.  As there is gum swelling with fluctuance, erythema, and facial swelling, will treat with antibiotic and analgesics. Urged patient to follow-up with dentist.  Patient given dentist contact info, encouraged to follow up in 1-2 days for ultimate management of dental pain and overall dental  health. Strict ED return precautions given. Pt is aware of red flag symptoms that would warrant return to ED for re-evaluation and further treatment. Patient voices understanding and is agreeable to plan.  Patient aware of symtoms to mointor for that would warrant return to ED for re-evaluation. Dental block in ED provided complete relief of pain.   Final Clinical Impressions(s) / ED Diagnoses   Final diagnoses:  Pain, dental  Dental abscess    New Prescriptions Discharge Medication List as of 04/19/2016 11:22 AM    START taking these medications   Details  penicillin v potassium (VEETID) 500 MG tablet Take 1 tablet (500 mg total) by mouth 3 (three) times daily., Starting Sun 04/19/2016, Until Sun 04/26/2016, Print         Liberty Handy, PA-C 04/19/16 1316    Eber Hong, MD 04/19/16 [phone]

## 2016-04-19 NOTE — ED Triage Notes (Signed)
Patient c/o right lower dental pain x1 week. Patient reports taking tylenol with no relief-last dose yesterday at 4pm. Denies any fevers. Per patient pain radiates into right side of head.

## 2016-06-21 ENCOUNTER — Emergency Department (HOSPITAL_COMMUNITY)
Admission: EM | Admit: 2016-06-21 | Discharge: 2016-06-21 | Disposition: A | Discharge status: Home / Self Care | Payer: Self-pay | Attending: Emergency Medicine | Admitting: Emergency Medicine

## 2016-06-21 ENCOUNTER — Encounter (HOSPITAL_COMMUNITY): Payer: Self-pay | Admitting: *Deleted

## 2016-06-21 DIAGNOSIS — K029 Dental caries, unspecified: Secondary | ICD-10-CM

## 2016-06-21 DIAGNOSIS — F17210 Nicotine dependence, cigarettes, uncomplicated: Secondary | ICD-10-CM | POA: Insufficient documentation

## 2016-06-21 DIAGNOSIS — R740 Nonspecific elevation of levels of transaminase and lactic acid dehydrogenase [LDH]: Secondary | ICD-10-CM

## 2016-06-21 DIAGNOSIS — Z5181 Encounter for therapeutic drug level monitoring: Secondary | ICD-10-CM | POA: Insufficient documentation

## 2016-06-21 DIAGNOSIS — T391X1A Poisoning by 4-Aminophenol derivatives, accidental (unintentional), initial encounter: Secondary | ICD-10-CM

## 2016-06-21 DIAGNOSIS — R7401 Elevation of levels of liver transaminase levels: Secondary | ICD-10-CM

## 2016-06-21 LAB — COMPREHENSIVE METABOLIC PANEL
ALBUMIN: 4.7 g/dL (ref 3.5–5.0)
ALK PHOS: 67 U/L (ref 38–126)
ALK PHOS: 60 U/L (ref 38–126)
ALT: 68 U/L — AB (ref 17–63)
ALT: 59 U/L (ref 17–63)
ANION GAP: 13 (ref 5–15)
ANION GAP: 9 (ref 5–15)
AST: 46 U/L — ABNORMAL HIGH (ref 15–41)
AST: 40 U/L (ref 15–41)
Albumin: 4.1 g/dL (ref 3.5–5.0)
BILIRUBIN TOTAL: 1.2 mg/dL (ref 0.3–1.2)
BUN: 16 mg/dL (ref 6–20)
BUN: 16 mg/dL (ref 6–20)
CALCIUM: 10 mg/dL (ref 8.9–10.3)
CHLORIDE: 101 mmol/L (ref 101–111)
CO2: 26 mmol/L (ref 22–32)
CO2: 27 mmol/L (ref 22–32)
CREATININE: 1.07 mg/dL (ref 0.61–1.24)
CREATININE: 0.97 mg/dL (ref 0.61–1.24)
Calcium: 9.5 mg/dL (ref 8.9–10.3)
Chloride: 100 mmol/L — ABNORMAL LOW (ref 101–111)
GFR calc Af Amer: >60 mL/min (ref >60)
GFR calc non Af Amer: >60 mL/min (ref >60)
GFR calc non Af Amer: >60 mL/min (ref >60)
GLUCOSE: 105 mg/dL — AB (ref 65–99)
GLUCOSE: 100 mg/dL — AB (ref 65–99)
Potassium: 3.4 mmol/L — ABNORMAL LOW (ref 3.5–5.1)
Potassium: 3.5 mmol/L (ref 3.5–5.1)
SODIUM: 136 mmol/L (ref 135–145)
Sodium: 140 mmol/L (ref 135–145)
TOTAL PROTEIN: 8 g/dL (ref 6.5–8.1)
Total Bilirubin: 1.1 mg/dL (ref 0.3–1.2)
Total Protein: 8.8 g/dL — ABNORMAL HIGH (ref 6.5–8.1)

## 2016-06-21 LAB — CBC WITH DIFFERENTIAL/PLATELET
Basophils Absolute: 0.1 10*3/uL (ref 0.0–0.1)
Basophils Relative: 1 %
Eosinophils Absolute: 0.2 10*3/uL (ref 0.0–0.7)
Eosinophils Relative: 2 %
HEMATOCRIT: 44.2 % (ref 39.0–52.0)
Hemoglobin: 15.4 g/dL (ref 13.0–17.0)
LYMPHS ABS: 3.1 10*3/uL (ref 0.7–4.0)
Lymphocytes Relative: 31 %
MCH: 30.7 pg (ref 26.0–34.0)
MCHC: 34.8 g/dL (ref 30.0–36.0)
MCV: 88 fL (ref 78.0–100.0)
MONO ABS: 0.7 10*3/uL (ref 0.1–1.0)
Monocytes Relative: 7 %
NEUTROS ABS: 6 10*3/uL (ref 1.7–7.7)
Neutrophils Relative %: 59 %
Platelets: 189 10*3/uL (ref 150–400)
RBC: 5.02 MIL/uL (ref 4.22–5.81)
RDW: 13.1 % (ref 11.5–15.5)
WBC: 10.1 10*3/uL (ref 4.0–10.5)

## 2016-06-21 LAB — ACETAMINOPHEN LEVEL
Acetaminophen (Tylenol), Serum: 19 ug/mL (ref 10–30)
Acetaminophen (Tylenol), Serum: <10 ug/mL — ABNORMAL LOW (ref 10–30)

## 2016-06-21 LAB — PROTIME-INR
INR: 0.91
Prothrombin Time: 12.2 seconds (ref 11.4–15.2)

## 2016-06-21 LAB — SALICYLATE LEVEL: Salicylate Lvl: <7 mg/dL (ref 2.8–30.0)

## 2016-06-21 MED ORDER — AMOXICILLIN 250 MG PO CAPS
1000.0000 mg | ORAL_CAPSULE | Freq: Once | ORAL | Status: AC
  Administered 2016-06-21: 1000 mg via ORAL
  Filled 2016-06-21: qty 4

## 2016-06-21 MED ORDER — KETOROLAC TROMETHAMINE 30 MG/ML IJ SOLN
30.0000 mg | Freq: Once | INTRAMUSCULAR | Status: AC
  Administered 2016-06-21: 30 mg via INTRAVENOUS
  Filled 2016-06-21: qty 1

## 2016-06-21 MED ORDER — AMOXICILLIN 500 MG PO CAPS: 1000.0000 mg | ORAL_CAPSULE | Freq: Two times a day (BID) | ORAL | 0 refills | Status: DC

## 2016-06-21 MED ORDER — OXYCODONE-ACETAMINOPHEN 5-325 MG PO TABS: 1.0000 | ORAL_TABLET | ORAL | 0 refills | Status: DC | PRN

## 2016-06-21 MED ORDER — MORPHINE SULFATE (PF) 4 MG/ML IV SOLN
4.0000 mg | Freq: Once | INTRAVENOUS | Status: AC
  Administered 2016-06-21: 4 mg via INTRAVENOUS
  Filled 2016-06-21: qty 1

## 2016-06-21 MED ORDER — LIDOCAINE HCL (PF) 2 % IJ SOLN: 10.0000 mL | Freq: Once | INTRAMUSCULAR | Status: DC

## 2016-06-21 NOTE — ED Provider Notes (Signed)
[facility] Provider Note   CSN: [id number] Arrival date & time: 06/21/16  0232     History   Chief Complaint Chief Complaint  Patient presents with  . Dental Pain    HPI William Bartlett is a 26 y.o. male.  The history is provided by the patient.  He complains of pain in the right lower molar. He states the tooth has a hole in it but he has not been able to get into see a dentist. He was seen in the ED for this and was generally doing well, but pain got much worse over the last 24 hours. He rates pain at 10/10. He has been taking acetaminophen for it and states that he has taken a bottle of 150 acetaminophen 325 mg tablets over the last 2 days. He also took 1 dose of hydrocodone 10 mg which did give temporary relief.  History reviewed. No pertinent past medical history.  There are no active problems to display for this patient.   History reviewed. No pertinent surgical history.     Home Medications    Prior to Admission medications   Medication Sig Start Date End Date Taking? Authorizing Provider  cephALEXin (KEFLEX) 500 MG capsule Take 1 capsule (500 mg total) by mouth 4 (four) times daily. 10/24/13   Donnetta Hutching, MD  oxyCODONE-acetaminophen (PERCOCET) 5-325 MG per tablet Take 2 tablets by mouth every 4 (four) hours as needed. 10/24/13   Donnetta Hutching, MD    Family History No family history on file.  Social History Social History  Substance Use Topics  . Smoking status: Current Every Day Smoker    Packs/day: 0.50    Years: 7.00    Types: Cigarettes  . Smokeless tobacco: Never Used  . Alcohol use 3.0 oz/week    5 Shots of liquor per week     Allergies   Pineapple   Review of Systems Review of Systems  All other systems reviewed and are negative.    Physical Exam Updated Vital Signs BP (!) 136/91   Pulse 79   Temp 98.6 F (37 C) (Oral)   Resp 20   Ht 6\' 2"  (1.88 m)   Wt 81.6 kg (180 lb)   SpO2 100%   BMI 23.11 kg/m   Physical Exam    Nursing note and vitals reviewed.  26 year old male, resting comfortably and in no acute distress. Vital signs are significant for borderline hypertension. Oxygen saturation is 100%, which is normal. Head is normocephalic and atraumatic. PERRLA, EOMI. No scleral icterus. Oropharynx is clear. Tooth #32 has extensive caries and is very tender to percussion. Neck is nontender and supple without adenopathy or JVD. Back is nontender and there is no CVA tenderness. Lungs are clear without rales, wheezes, or rhonchi. Chest is nontender. Heart has regular rate and rhythm without murmur. Abdomen is soft, flat, nontender without masses or hepatosplenomegaly and peristalsis is normoactive. Extremities have no cyanosis or edema, full range of motion is present. Skin is warm and dry without rash. Neurologic: Mental status is normal, cranial nerves are intact, there are no motor or sensory deficits.  ED Treatments / Results  Labs (all labs ordered are listed, but only abnormal results are displayed) Labs Reviewed  COMPREHENSIVE METABOLIC PANEL - Abnormal; Notable for the following:       Result Value   Potassium 3.4 (*)    Glucose, Bld 105 (*)    Total Protein 8.8 (*)    AST 46 (*)  ALT 68 (*)    All other components within normal limits  CBC WITH DIFFERENTIAL/PLATELET  SALICYLATE LEVEL  ACETAMINOPHEN LEVEL  PROTIME-INR  COMPREHENSIVE METABOLIC PANEL  ACETAMINOPHEN LEVEL   Procedures Procedures (including critical care time)  Medications Ordered in ED Medications  morphine 4 MG/ML injection 4 mg (not administered)  ketorolac (TORADOL) 30 MG/ML injection 30 mg (not administered)     Initial Impression / Assessment and Plan / ED Course  I have reviewed the triage vital signs and the nursing notes.  Pertinent lab results that were available during my care of the patient were reviewed by me and considered in my medical decision making (see chart for details).  Dental pain secondary  to caries of tooth #32. Old records are reviewed confirming ED visit April 8 for dental pain. He will need to be given dental resource guide. However, I am much more concerned about the large number of acetaminophen tablet C2. Will check liver function tests and acetaminophen level. [facility] [place].  Acetaminophen level is come back at 19 which is in the therapeutic range. Laboratory workup does show elevated transaminases with AST 46, ALT 68. Post initial was consulted and recommended repeat acetaminophen level and transaminases. If transaminases are stable and acetaminophen is trending down, they felt that he would be stable for discharge. In the meantime, he has been given within for pain with significant relief. He is given a dose of amoxicillin. Case is signed out to Dr. Erin HearingMessner.  Final Clinical Impressions(s) / ED Diagnoses   Final diagnoses:  Dental caries  Accidental acetaminophen overdose, initial encounter  Elevated transaminase level    New Prescriptions New Prescriptions   AMOXICILLIN (AMOXIL) 500 MG CAPSULE    Take 2 capsules (1,000 mg total) by mouth 2 (two) times daily.     Dione BoozeGlick, [place], MD 06/21/16 [phone]

## 2016-06-21 NOTE — ED Triage Notes (Signed)
Pt c/o toothache to right lower jaw area that started two days ago, pain radiates to right side of facial area.

## 2016-06-21 NOTE — ED Notes (Signed)
Spoke w/ Patty at poison control. Advised repeat tylenol & liver functions at 0800. In tylenol is going down & liver remaining the same DC pt home.

## 2017-12-26 ENCOUNTER — Emergency Department (HOSPITAL_COMMUNITY)
Admission: EM | Admit: 2017-12-26 | Discharge: 2017-12-26 | Disposition: A | Discharge status: Home / Self Care | Payer: Self-pay | Attending: Emergency Medicine | Admitting: Emergency Medicine

## 2017-12-26 ENCOUNTER — Encounter (HOSPITAL_COMMUNITY): Payer: Self-pay | Admitting: Emergency Medicine

## 2017-12-26 DIAGNOSIS — T162XXA Foreign body in left ear, initial encounter: Secondary | ICD-10-CM | POA: Insufficient documentation

## 2017-12-26 DIAGNOSIS — F17210 Nicotine dependence, cigarettes, uncomplicated: Secondary | ICD-10-CM | POA: Insufficient documentation

## 2017-12-26 DIAGNOSIS — Y939 Activity, unspecified: Secondary | ICD-10-CM | POA: Insufficient documentation

## 2017-12-26 DIAGNOSIS — W228XXA Striking against or struck by other objects, initial encounter: Secondary | ICD-10-CM | POA: Insufficient documentation

## 2017-12-26 DIAGNOSIS — Y929 Unspecified place or not applicable: Secondary | ICD-10-CM | POA: Insufficient documentation

## 2017-12-26 DIAGNOSIS — Y999 Unspecified external cause status: Secondary | ICD-10-CM | POA: Insufficient documentation

## 2017-12-26 DIAGNOSIS — Z79899 Other long term (current) drug therapy: Secondary | ICD-10-CM | POA: Insufficient documentation

## 2017-12-26 NOTE — ED Notes (Signed)
Have irrigated patients ear again. Unsuccessfully able to get remnants of bug out.

## 2017-12-26 NOTE — ED Notes (Signed)
Have successfully removed body of bug from pt's ear. Still 1 remnant of a leg remaining in ear

## 2017-12-26 NOTE — ED Provider Notes (Signed)
[facility] EMERGENCY DEPARTMENT Provider Note   CSN: [id number] Arrival date & time: 12/26/17  0701     History   Chief Complaint Chief Complaint  Patient presents with  . Foreign Body in Ear    HPI William Bartlett is a 27 y.o. male.  Patient awoke this morning with sensation that he had a bug in his left ear.     History reviewed. No pertinent past medical history.  There are no active problems to display for this patient.   History reviewed. No pertinent surgical history.      Home Medications    Prior to Admission medications   Medication Sig Start Date End Date Taking? Authorizing Provider  amoxicillin (AMOXIL) 500 MG capsule Take 2 capsules (1,000 mg total) by mouth 2 (two) times daily. 06/21/16   Dione BoozeGlick, David, MD  oxyCODONE-acetaminophen (PERCOCET) 5-325 MG tablet Take 1 tablet by mouth every 4 (four) hours as needed. 06/21/16   Dione BoozeGlick, David, MD    Family History History reviewed. No pertinent family history.  Social History Social History   Tobacco Use  . Smoking status: Current Every Day Smoker    Packs/day: 0.50    Years: 7.00    Pack years: 3.50    Types: Cigarettes  . Smokeless tobacco: Never Used  Substance Use Topics  . Alcohol use: Yes    Alcohol/week: 5.0 standard drinks    Types: 5 Shots of liquor per week    Comment: daily  . Drug use: No     Allergies   Pineapple   Review of Systems Review of Systems  Constitutional: Negative for fever.  HENT: Positive for congestion and ear pain.   Eyes: Negative for visual disturbance.  Respiratory: Negative for shortness of breath.   Cardiovascular: Negative for chest pain.  Gastrointestinal: Negative for abdominal pain.  Genitourinary: Negative for dysuria.  Musculoskeletal: Negative for neck pain.  Skin: Negative for rash.  Neurological: Negative for syncope.  Hematological: Does not bruise/bleed easily.  Psychiatric/Behavioral: Negative for confusion.     Physical  Exam Updated Vital Signs BP (!) 154/100 (BP Location: Right Arm)   Pulse 86   Temp 98 F (36.7 C) (Oral)   Resp 18   Ht 1.905 m (6\' 3" )   Wt 104.3 kg   SpO2 100%   BMI 28.75 kg/m   Physical Exam Vitals signs and nursing note reviewed.  Constitutional:      General: He is not in acute distress.    Appearance: Normal appearance.  HENT:     Head: Normocephalic.     Right Ear: Tympanic membrane normal.     Ears:     Comments: Patient's left ear canal with evidence of an insect along the tympanic membrane area.  Patient was rubbing the outside skin of his ear and has abraded some of that off.  No bleeding.    Mouth/Throat:     Mouth: Mucous membranes are moist.  Eyes:     Extraocular Movements: Extraocular movements intact.     Conjunctiva/sclera: Conjunctivae normal.     Pupils: Pupils are equal, round, and reactive to light.  Neck:     Musculoskeletal: Normal range of motion and neck supple.  Cardiovascular:     Rate and Rhythm: Normal rate and regular rhythm.  Pulmonary:     Effort: Pulmonary effort is normal.     Breath sounds: Normal breath sounds.  Abdominal:     Tenderness: There is no abdominal tenderness.  Lymphadenopathy:  Cervical: No cervical adenopathy.  Skin:    General: Skin is warm.  Neurological:     General: No focal deficit present.     Mental Status: He is alert and oriented to person, place, and time.      ED Treatments / Results  Labs (all labs ordered are listed, but only abnormal results are displayed) Labs Reviewed - No data to display  EKG None  Radiology No results found.  Procedures Procedures (including critical care time)  Medications Ordered in ED Medications - No data to display   Initial Impression / Assessment and Plan / ED Course  I have reviewed the triage vital signs and the nursing notes.  Pertinent labs & imaging results that were available during my care of the patient were reviewed by me and considered in my  medical decision making (see chart for details).     Insect foreign body left ear canal was killed with hydrogen peroxide and drowning with fluid.  About 50% of the insect was able to be removed.  We tried continuing to irrigate can get the rest to come out.  Insect is definitely dead.  Will refer patient to ear nose and throat for follow-up.   Final Clinical Impressions(s) / ED Diagnoses   Final diagnoses:  Foreign body of left ear, initial encounter    ED Discharge Orders    None       Vanetta Mulders, MD 12/26/17 [phone]

## 2017-12-26 NOTE — ED Triage Notes (Signed)
Pt has bug in left ear upon waking.

## 2017-12-26 NOTE — Discharge Instructions (Addendum)
As we discussed able to get some of the insect out from the left ear but there is still some insect parts in there that were unable to get out.  Follow-up with ear nose and throat give him a call on Monday.  Motrin as needed for pain.  Very important that she follow-up to get the remaining part of the insect removed.

## 2018-09-07 ENCOUNTER — Emergency Department (HOSPITAL_COMMUNITY)
Admission: EM | Admit: 2018-09-07 | Discharge: 2018-09-07 | Disposition: A | Discharge status: Home / Self Care | Payer: Self-pay | Attending: Emergency Medicine | Admitting: Emergency Medicine

## 2018-09-07 ENCOUNTER — Emergency Department (HOSPITAL_COMMUNITY): Payer: Self-pay

## 2018-09-07 ENCOUNTER — Encounter (HOSPITAL_COMMUNITY): Payer: Self-pay | Admitting: *Deleted

## 2018-09-07 ENCOUNTER — Other Ambulatory Visit: Payer: Self-pay

## 2018-09-07 DIAGNOSIS — F17210 Nicotine dependence, cigarettes, uncomplicated: Secondary | ICD-10-CM | POA: Insufficient documentation

## 2018-09-07 DIAGNOSIS — R112 Nausea with vomiting, unspecified: Secondary | ICD-10-CM | POA: Insufficient documentation

## 2018-09-07 DIAGNOSIS — R1084 Generalized abdominal pain: Secondary | ICD-10-CM

## 2018-09-07 LAB — CBC WITH DIFFERENTIAL/PLATELET
Abs Immature Granulocytes: 0.02 10*3/uL (ref 0.00–0.07)
Basophils Absolute: 0.1 10*3/uL (ref 0.0–0.1)
Basophils Relative: 1 %
Eosinophils Absolute: 0 10*3/uL (ref 0.0–0.5)
Eosinophils Relative: 0 %
HCT: 49.8 % (ref 39.0–52.0)
Hemoglobin: 16.7 g/dL (ref 13.0–17.0)
Immature Granulocytes: 0 %
Lymphocytes Relative: 24 %
Lymphs Abs: 2.6 10*3/uL (ref 0.7–4.0)
MCH: 30.4 pg (ref 26.0–34.0)
MCHC: 33.5 g/dL (ref 30.0–36.0)
MCV: 90.7 fL (ref 80.0–100.0)
Monocytes Absolute: 1.1 10*3/uL — ABNORMAL HIGH (ref 0.1–1.0)
Monocytes Relative: 10 %
Neutro Abs: 7 10*3/uL (ref 1.7–7.7)
Neutrophils Relative %: 65 %
Platelets: 216 10*3/uL (ref 150–400)
RBC: 5.49 MIL/uL (ref 4.22–5.81)
RDW: 13.4 % (ref 11.5–15.5)
WBC: 10.8 10*3/uL — ABNORMAL HIGH (ref 4.0–10.5)
nRBC: 0 % (ref 0.0–0.2)

## 2018-09-07 LAB — URINALYSIS, ROUTINE W REFLEX MICROSCOPIC
Bacteria, UA: NONE SEEN
Bilirubin Urine: NEGATIVE
Glucose, UA: NEGATIVE mg/dL
Hgb urine dipstick: NEGATIVE
Ketones, ur: 5 mg/dL — AB
Leukocytes,Ua: NEGATIVE
Nitrite: NEGATIVE
Protein, ur: 30 mg/dL — AB
Specific Gravity, Urine: >1.046 — ABNORMAL HIGH (ref 1.005–1.030)
pH: 8 (ref 5.0–8.0)

## 2018-09-07 LAB — COMPREHENSIVE METABOLIC PANEL
ALT: 22 U/L (ref 0–44)
AST: 25 U/L (ref 15–41)
Albumin: 4.5 g/dL (ref 3.5–5.0)
Alkaline Phosphatase: 80 U/L (ref 38–126)
Anion gap: 12 (ref 5–15)
BUN: 8 mg/dL (ref 6–20)
CO2: 32 mmol/L (ref 22–32)
Calcium: 9.5 mg/dL (ref 8.9–10.3)
Chloride: 92 mmol/L — ABNORMAL LOW (ref 98–111)
Creatinine, Ser: 1.09 mg/dL (ref 0.61–1.24)
GFR calc Af Amer: >60 mL/min (ref >60)
GFR calc non Af Amer: >60 mL/min (ref >60)
Glucose, Bld: 121 mg/dL — ABNORMAL HIGH (ref 70–99)
Potassium: 3 mmol/L — ABNORMAL LOW (ref 3.5–5.1)
Sodium: 136 mmol/L (ref 135–145)
Total Bilirubin: 1.1 mg/dL (ref 0.3–1.2)
Total Protein: 8.5 g/dL — ABNORMAL HIGH (ref 6.5–8.1)

## 2018-09-07 LAB — LIPASE, BLOOD: Lipase: 24 U/L (ref 11–51)

## 2018-09-07 MED ORDER — DICYCLOMINE HCL 20 MG PO TABS: 20.0000 mg | ORAL_TABLET | Freq: Three times a day (TID) | ORAL | 0 refills | Status: DC | PRN

## 2018-09-07 MED ORDER — PANTOPRAZOLE SODIUM 40 MG PO TBEC: 40.0000 mg | DELAYED_RELEASE_TABLET | Freq: Every day | ORAL | 0 refills | Status: AC

## 2018-09-07 MED ORDER — ONDANSETRON HCL 4 MG/2ML IJ SOLN
4.0000 mg | Freq: Once | INTRAMUSCULAR | Status: AC
  Administered 2018-09-07: 11:00:00 4 mg via INTRAVENOUS
  Filled 2018-09-07: qty 2

## 2018-09-07 MED ORDER — DICYCLOMINE HCL 10 MG/ML IM SOLN
20.0000 mg | Freq: Once | INTRAMUSCULAR | Status: AC
  Administered 2018-09-07: 11:00:00 20 mg via INTRAMUSCULAR
  Filled 2018-09-07: qty 2

## 2018-09-07 MED ORDER — ONDANSETRON 4 MG PO TBDP: 4.0000 mg | ORAL_TABLET | Freq: Three times a day (TID) | ORAL | 0 refills | Status: DC | PRN

## 2018-09-07 MED ORDER — SODIUM CHLORIDE 0.9 % IV BOLUS
1000.0000 mL | Freq: Once | INTRAVENOUS | Status: AC
  Administered 2018-09-07: 11:00:00 1000 mL via INTRAVENOUS

## 2018-09-07 MED ORDER — IOHEXOL 300 MG/ML  SOLN
100.0000 mL | Freq: Once | INTRAMUSCULAR | Status: AC | PRN
  Administered 2018-09-07: 12:00:00 100 mL via INTRAVENOUS

## 2018-09-07 MED ORDER — SUCRALFATE 1 G PO TABS: 1.0000 g | ORAL_TABLET | Freq: Three times a day (TID) | ORAL | 0 refills | Status: AC

## 2018-09-07 MED ORDER — POTASSIUM CHLORIDE CRYS ER 20 MEQ PO TBCR
40.0000 meq | EXTENDED_RELEASE_TABLET | Freq: Once | ORAL | Status: AC
  Administered 2018-09-07: 12:00:00 40 meq via ORAL
  Filled 2018-09-07: qty 2

## 2018-09-07 NOTE — ED Triage Notes (Signed)
Patient presents to the ED with generalized abdominal pain for 3 days.  Patient has been vomiting for days, and states he vomited 6 times since he woke up at 7 am today.  Patient denies fever.  Last BM yesterday.

## 2018-09-07 NOTE — Discharge Instructions (Signed)
You have been seen in the Emergency Department (ED) for abdominal pain.  Your evaluation did not identify a clear cause of your symptoms but was generally reassuring.  Please follow up as instructed above regarding todays emergent visit and the symptoms that are bothering you.  Please abstain from alcohol and marijuana.  If your symptoms continue you may need to see a gastroenterologist.  I have listed the names of both a primary care physician as well as gastroenterologist on your paperwork.  Return to the ED if your abdominal pain worsens or fails to improve, you develop bloody vomiting, bloody diarrhea, you are unable to tolerate fluids due to vomiting, fever greater than 101, or other symptoms that concern you.

## 2018-09-07 NOTE — ED Provider Notes (Signed)
Emergency Department Provider Note   I have reviewed the triage vital signs and the nursing notes.   HISTORY  Chief Complaint No chief complaint on file.   HPI William Bartlett is a 28 y.o. male presents to the emergency department for evaluation of generalized abdominal pain with vomiting and diarrhea.  He reports vomiting up to 6 times already today without blood.  Similarly, he has frequent diarrhea without blood.  No focal abdominal discomfort.  Denies fevers or chills.  Symptoms began 3 days ago after drinking alcohol in celebration of his birthday.  He did smoke some marijuana during this celebration as well.  He smokes marijuana occasionally but not daily.  Denies history of pancreatitis.  Denies chest pain or shortness of breath. No travel or sick contacts.    History reviewed. No pertinent past medical history.  There are no active problems to display for this patient.   History reviewed. No pertinent surgical history.  Allergies Pineapple  History reviewed. No pertinent family history.  Social History Social History   Tobacco Use   Smoking status: Current Every Day Smoker    Packs/day: 0.50    Years: 7.00    Pack years: 3.50    Types: Cigarettes   Smokeless tobacco: Never Used  Substance Use Topics   Alcohol use: Yes    Alcohol/week: 5.0 standard drinks    Types: 5 Shots of liquor per week    Comment: daily   Drug use: No    Review of Systems  Constitutional: No fever/chills Eyes: No visual changes. ENT: No sore throat. Cardiovascular: Denies chest pain. Respiratory: Denies shortness of breath. Gastrointestinal: Positive diffuse abdominal pain. Positive nausea, vomiting, and diarrhea.  No constipation. Genitourinary: Negative for dysuria. Musculoskeletal: Negative for back pain. Skin: Negative for rash. Neurological: Negative for headaches.  10-point ROS otherwise negative.  ____________________________________________   PHYSICAL  EXAM:  VITAL SIGNS: ED Triage Vitals  Enc Vitals Group     BP 09/07/18 1021 (!) 132/96     Pulse Rate 09/07/18 1021 63     Resp 09/07/18 1021 15     Temp 09/07/18 1021 98.8 F (37.1 C)     Temp Source 09/07/18 1021 Oral     SpO2 09/07/18 1021 98 %     Weight 09/07/18 1022 190 lb (86.2 kg)     Height 09/07/18 1022 6\' 3"  (1.905 m)   Constitutional: Alert and oriented. Well appearing and in no acute distress. Eyes: Conjunctivae are normal.  Head: Atraumatic. Nose: No congestion/rhinnorhea. Mouth/Throat: Mucous membranes are moist.  Neck: No stridor.   Cardiovascular: Normal rate, regular rhythm. Good peripheral circulation. Grossly normal heart sounds.   Respiratory: Normal respiratory effort.  No retractions. Lungs CTAB. Gastrointestinal: Soft with mild diffuse tenderness. No focal tenderness, rebound, or guarding. No distention.  Musculoskeletal: No lower extremity tenderness nor edema. No gross deformities of extremities. Neurologic:  Normal speech and language.  Skin:  Skin is warm, dry and intact. No rash noted.   ____________________________________________   LABS (all labs ordered are listed, but only abnormal results are displayed)  Labs Reviewed  COMPREHENSIVE METABOLIC PANEL - Abnormal; Notable for the following components:      Result Value   Potassium 3.0 (*)    Chloride 92 (*)    Glucose, Bld 121 (*)    Total Protein 8.5 (*)    All other components within normal limits  CBC WITH DIFFERENTIAL/PLATELET - Abnormal; Notable for the following components:   WBC 10.8 (*)  Monocytes Absolute 1.1 (*)    All other components within normal limits  URINALYSIS, ROUTINE W REFLEX MICROSCOPIC - Abnormal; Notable for the following components:   Specific Gravity, Urine >1.046 (*)    Ketones, ur 5 (*)    Protein, ur 30 (*)    All other components within normal limits  LIPASE, BLOOD   ____________________________________________  RADIOLOGY  Ct Abdomen Pelvis W  Contrast  Result Date: 09/07/2018 CLINICAL DATA:  Generalized abdominal pain for 3 days.  Vomiting. EXAM: CT ABDOMEN AND PELVIS WITH CONTRAST TECHNIQUE: Multidetector CT imaging of the abdomen and pelvis was performed using the standard protocol following bolus administration of intravenous contrast. CONTRAST:  100mL OMNIPAQUE IOHEXOL 300 MG/ML  SOLN COMPARISON:  None. FINDINGS: Lower chest: Lung bases are clear. Hepatobiliary: There is fatty infiltration near the fissure for the ligamentum teres. No other focal liver lesions are evident. Gallbladder wall is not appreciably thickened. There is no biliary duct dilatation. Pancreas: There is no pancreatic mass or inflammatory focus. Spleen: No splenic lesions are evident. Adrenals/Urinary Tract: Adrenals bilaterally appear unremarkable. Kidneys bilaterally show no evident mass or hydronephrosis on either side. Mild fetal lobulation bilaterally is an anatomic variant. There is no evident renal or ureteral calculus on either side. Urinary bladder is midline with wall thickness upper normal. Stomach/Bowel: There is no appreciable bowel wall or mesenteric thickening. No evident bowel obstruction. Terminal ileum appears normal. There is no evident free air or portal venous air. Vascular/Lymphatic: No abdominal aortic aneurysm. No vascular lesions are evident. There is no demonstrable adenopathy in the abdomen or pelvis. Reproductive: Prostate and seminal vesicles appear normal in size and contour. No evident pelvic mass. Other: Appendix appears normal. No abscess or ascites evident in the abdomen or pelvis. Musculoskeletal: There are no blastic or lytic bone lesions. No intramuscular or abdominal wall lesions evident. IMPRESSION: 1. Urinary bladder wall thickness is upper normal. Advise correlation with urinalysis to assess for possible early cystitis. 2. No bowel obstruction. No abscess in the abdomen or pelvis. Appendix appears normal. 3. No evident renal or ureteral  calculus. No hydronephrosis on either side. Electronically Signed   By: Bretta BangWilliam  Woodruff III M.D.   On: 09/07/2018 12:25    ____________________________________________   PROCEDURES  Procedure(s) performed:   Procedures  None  ____________________________________________   INITIAL IMPRESSION / ASSESSMENT AND PLAN / ED COURSE  Pertinent labs & imaging results that were available during my care of the patient were reviewed by me and considered in my medical decision making (see chart for details).   Patient presents to the emergency department for evaluation of generalized abdominal pain with multiple episodes of vomiting and diarrhea.  Afebrile here.  Symptoms began in the setting of drinking and using marijuana.  Does not use marijuana daily.  No focal tenderness on abdominal exam.  Exam and history seem more consistent with an enteritis, possibly infectious.  Cyclical vomiting in the setting of marijuana use also possibility versus pancreatitis.  Plan for screening lab work, fluids, symptom management, and reassess.  No immediate CT imaging ordered.  Patient with continued pain after initial treatment. Lipase normal. Proceeded to CT in this setting. No acute findings. Patient tolerating PO. Will discharge. Likely gastritis. Discussed PCP and GI follow up plan along with ED return precautions.  ____________________________________________  FINAL CLINICAL IMPRESSION(S) / ED DIAGNOSES  Final diagnoses:  Generalized abdominal pain  Non-intractable vomiting with nausea, unspecified vomiting type     MEDICATIONS GIVEN DURING THIS VISIT:  Medications  sodium chloride 0.9 % bolus 1,000 mL (0 mLs Intravenous Stopped 09/07/18 1303)  ondansetron (ZOFRAN) injection 4 mg (4 mg Intravenous Given 09/07/18 1102)  dicyclomine (BENTYL) injection 20 mg (20 mg Intramuscular Given 09/07/18 1104)  potassium chloride SA (K-DUR) CR tablet 40 mEq (40 mEq Oral Given 09/07/18 1149)  iohexol (OMNIPAQUE)  300 MG/ML solution 100 mL (100 mLs Intravenous Contrast Given 09/07/18 1154)     NEW OUTPATIENT MEDICATIONS STARTED DURING THIS VISIT:  Discharge Medication List as of 09/07/2018  2:17 PM    START taking these medications   Details  dicyclomine (BENTYL) 20 MG tablet Take 1 tablet (20 mg total) by mouth 3 (three) times daily as needed for spasms., Starting Wed 09/07/2018, Normal    ondansetron (ZOFRAN ODT) 4 MG disintegrating tablet Take 1 tablet (4 mg total) by mouth every 8 (eight) hours as needed., Starting Wed 09/07/2018, Normal    pantoprazole (PROTONIX) 40 MG tablet Take 1 tablet (40 mg total) by mouth daily., Starting Wed 09/07/2018, Until Fri 10/07/2018, Normal    sucralfate (CARAFATE) 1 g tablet Take 1 tablet (1 g total) by mouth 4 (four) times daily -  with meals and at bedtime for 7 days., Starting Wed 09/07/2018, Until Wed 09/14/2018, Normal        Note:  This document was prepared using Dragon voice recognition software and may include unintentional dictation errors.  Alona Bene, MD Emergency Medicine    Jhalen Eley, Arlyss Repress, MD 09/07/18 [phone]

## 2019-08-15 ENCOUNTER — Encounter (HOSPITAL_COMMUNITY): Payer: Self-pay | Admitting: Emergency Medicine

## 2019-08-15 ENCOUNTER — Other Ambulatory Visit: Payer: Self-pay

## 2019-08-15 DIAGNOSIS — F17210 Nicotine dependence, cigarettes, uncomplicated: Secondary | ICD-10-CM | POA: Insufficient documentation

## 2019-08-15 DIAGNOSIS — R509 Fever, unspecified: Secondary | ICD-10-CM | POA: Diagnosis present

## 2019-08-15 DIAGNOSIS — U071 COVID-19: Secondary | ICD-10-CM | POA: Insufficient documentation

## 2019-08-15 NOTE — ED Triage Notes (Signed)
Pt c/o headache and fever on and off for the last 4 days. Pt states no exposures to COVID that he is aware of.

## 2019-08-16 ENCOUNTER — Emergency Department (HOSPITAL_COMMUNITY)
Admission: EM | Admit: 2019-08-16 | Discharge: 2019-08-16 | Disposition: A | Discharge status: Home / Self Care | Payer: Commercial Managed Care - PPO | Attending: Emergency Medicine | Admitting: Emergency Medicine

## 2019-08-16 DIAGNOSIS — R509 Fever, unspecified: Secondary | ICD-10-CM

## 2019-08-16 LAB — SARS CORONAVIRUS 2 BY RT PCR (HOSPITAL ORDER, PERFORMED IN ~~LOC~~ HOSPITAL LAB): SARS Coronavirus 2: POSITIVE — AB

## 2019-08-16 NOTE — ED Provider Notes (Signed)
[facility] EMERGENCY DEPARTMENT Provider Note   CSN: [id number] Arrival date & time: 08/15/19  2307     History Chief Complaint  Patient presents with  . Fever  . Headache    William Bartlett is a 29 y.o. male.  Patient is a 29 year old male with no significant past medical history.  He presents today for evaluation of fever and headache.  This is been ongoing for the past 3 days.  He denies cough, sore throat, shortness of breath, abdominal pain, diarrhea, or other symptoms.  There are no aggravating or alleviating factors.  The history is provided by the patient.       History reviewed. No pertinent past medical history.  There are no problems to display for this patient.   History reviewed. No pertinent surgical history.     History reviewed. No pertinent family history.  Social History   Tobacco Use  . Smoking status: Current Every Day Smoker    Packs/day: 0.50    Years: 7.00    Pack years: 3.50    Types: Cigarettes  . Smokeless tobacco: Never Used  Substance Use Topics  . Alcohol use: Yes    Alcohol/week: 5.0 standard drinks    Types: 5 Shots of liquor per week    Comment: daily  . Drug use: No    Home Medications Prior to Admission medications   Medication Sig Start Date End Date Taking? Authorizing Provider  dicyclomine (BENTYL) 20 MG tablet Take 1 tablet (20 mg total) by mouth 3 (three) times daily as needed for spasms. 09/07/18   Long, Arlyss Repress, MD  ondansetron (ZOFRAN ODT) 4 MG disintegrating tablet Take 1 tablet (4 mg total) by mouth every 8 (eight) hours as needed. 09/07/18   Long, Arlyss Repress, MD  pantoprazole (PROTONIX) 40 MG tablet Take 1 tablet (40 mg total) by mouth daily. 09/07/18 10/07/18  Long, Arlyss Repress, MD  sucralfate (CARAFATE) 1 g tablet Take 1 tablet (1 g total) by mouth 4 (four) times daily -  with meals and at bedtime for 7 days. 09/07/18 09/14/18  Long, Arlyss Repress, MD    Allergies    Pineapple  Review of Systems   Review of Systems  All  other systems reviewed and are negative.   Physical Exam Updated Vital Signs BP (!) 129/95 (BP Location: Right Arm)   Pulse 77   Temp 98.4 F (36.9 C) (Oral)   Resp 16   Ht 6\' 3"  (1.905 m)   Wt 95.3 kg   SpO2 100%   BMI 26.25 kg/m   Physical Exam Vitals and nursing note reviewed.  Constitutional:      General: He is not in acute distress.    Appearance: He is well-developed. He is not diaphoretic.  HENT:     Head: Normocephalic and atraumatic.  Cardiovascular:     Rate and Rhythm: Normal rate and regular rhythm.     Heart sounds: No murmur heard.  No friction rub.  Pulmonary:     Effort: Pulmonary effort is normal. No respiratory distress.     Breath sounds: Normal breath sounds. No wheezing or rales.  Abdominal:     General: Bowel sounds are normal. There is no distension.     Palpations: Abdomen is soft.     Tenderness: There is no abdominal tenderness.  Musculoskeletal:        General: Normal range of motion.     Cervical back: Normal range of motion and neck supple.  Skin:  General: Skin is warm and dry.  Neurological:     Mental Status: He is alert and oriented to person, place, and time.     Coordination: Coordination normal.     ED Results / Procedures / Treatments   Labs (all labs ordered are listed, but only abnormal results are displayed) Labs Reviewed - No data to display  EKG None  Radiology No results found.  Procedures Procedures (including critical care time)  Medications Ordered in ED Medications - No data to display  ED Course  I have reviewed the triage vital signs and the nursing notes.  Pertinent labs & imaging results that were available during my care of the patient were reviewed by me and considered in my medical decision making (see chart for details).    MDM Rules/Calculators/A&P  Patient presenting with fever and headache of undetermined etiology.  Patient does not appear meningitic.  There is no nuchal rigidity and he  has full range of motion of the neck.  I have low suspicion for meningitis or other serious pathology.  Patient will be tested for Covid.  There is no hypoxia and vital signs are stable.  He is afebrile and I do not feel as though any further work-up is indicated.  William Bartlett was evaluated in Emergency Department on 08/16/2019 for the symptoms described in the history of present illness. He was evaluated in the context of the global COVID-19 pandemic, which necessitated consideration that the patient might be at risk for infection with the SARS-CoV-2 virus that causes COVID-19. Institutional protocols and algorithms that pertain to the evaluation of patients at risk for COVID-19 are in a state of rapid change based on information released by regulatory bodies including the CDC and federal and state organizations. These policies and algorithms were followed during the patient's care in the ED.   Final Clinical Impression(s) / ED Diagnoses Final diagnoses:  None    Rx / DC Orders ED Discharge Orders    None       William Lyons, MD 08/16/19 [phone]

## 2019-08-16 NOTE — ED Notes (Signed)
Pt called and notified of positive COVID result.

## 2019-08-16 NOTE — ED Notes (Signed)
Pt reports he is primarily here to make sure he does not have covid   He has had a fever and malaises for the past 4 days   He reports he does not know when he last took tylenol or motrin

## 2019-08-16 NOTE — Discharge Instructions (Signed)
Take Tylenol 1000 mg rotated with ibuprofen 600 mg every 4 hours as needed for fever.  Isolate at home until the results of your Covid test are known.

## 2020-05-15 IMAGING — CT CT ABDOMEN AND PELVIS WITH CONTRAST
2 of 4 series · 16 of 46 positions shown, 18 images · IV contrast (omnipaque)
Comparison: None.

CLINICAL DATA: Generalized abdominal pain for 3 days.  Vomiting.

EXAM:
CT ABDOMEN AND PELVIS WITH CONTRAST
TECHNIQUE: Multidetector CT imaging of the abdomen and pelvis was performed
using the standard protocol following bolus administration of
intravenous contrast.
CONTRAST:  100mL OMNIPAQUE IOHEXOL 300 MG/ML  SOLN

[Series 2: axial st · axial · 0.78mm/px · z∈[+878,+1278]mm · 13 of 91 slices shown, 15 images]
[im 6/91  soft-tissue]
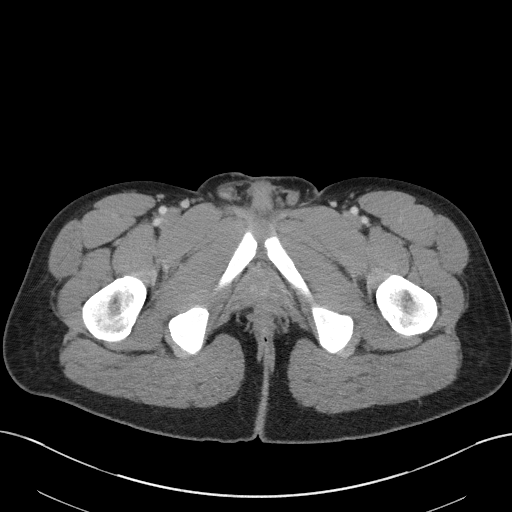
[im 6/91  bone]
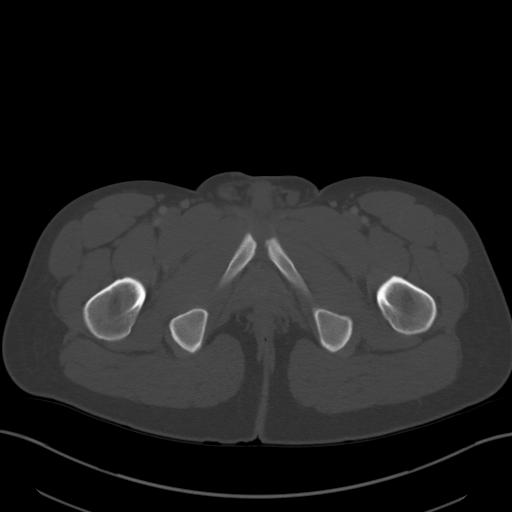
[im 11/91  soft-tissue]
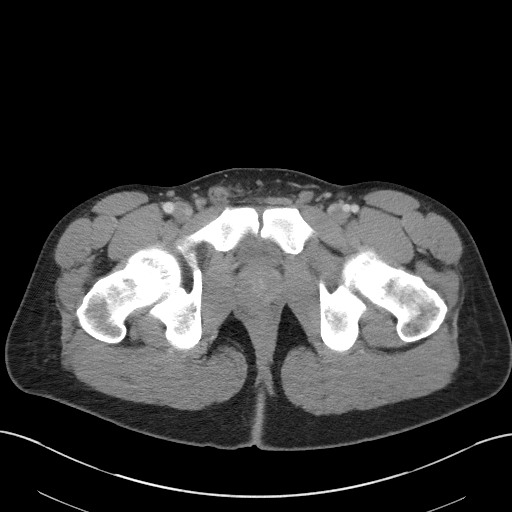
[im 21/91  soft-tissue]
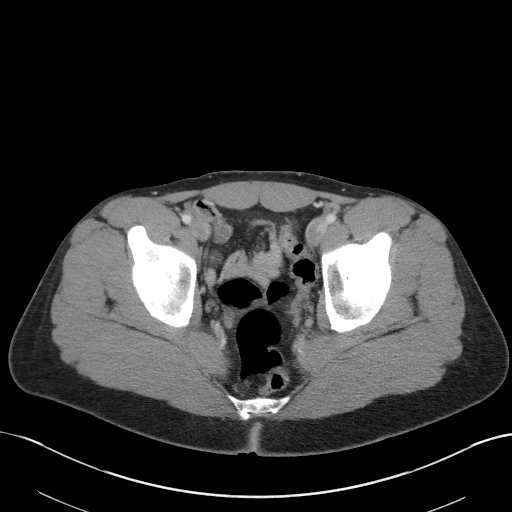
[im 26/91  soft-tissue]
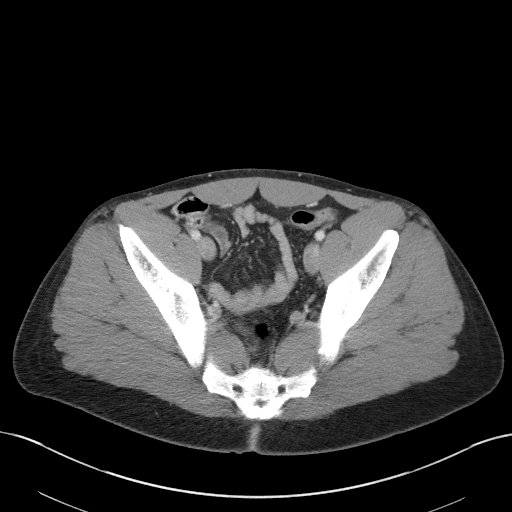
[im 31/91  soft-tissue]
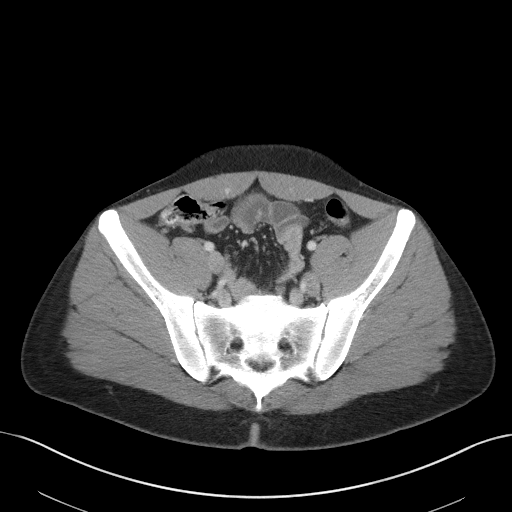
[im 41/91  soft-tissue]
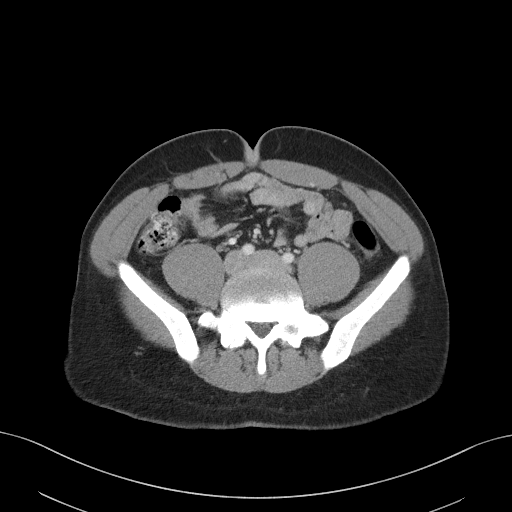
[im 46/91  soft-tissue]
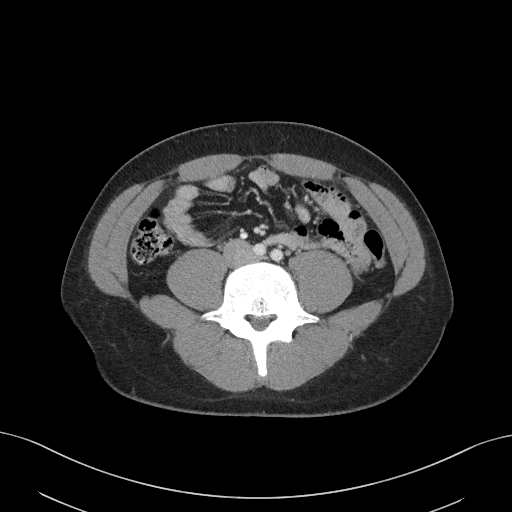
[im 51/91  soft-tissue]
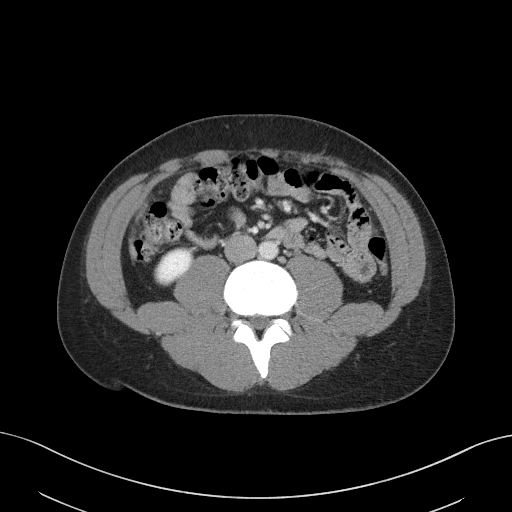
[im 61/91  soft-tissue]
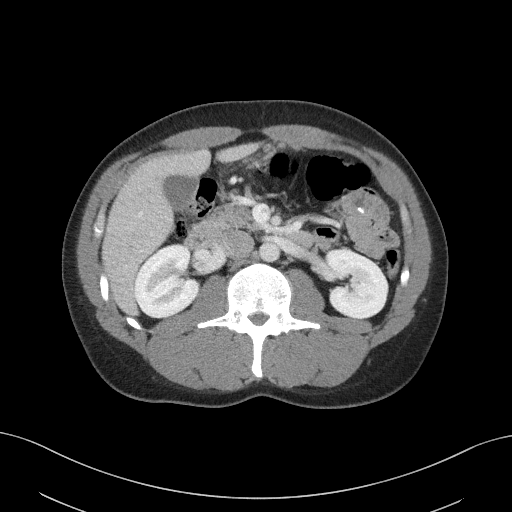
[im 61/91  bone]
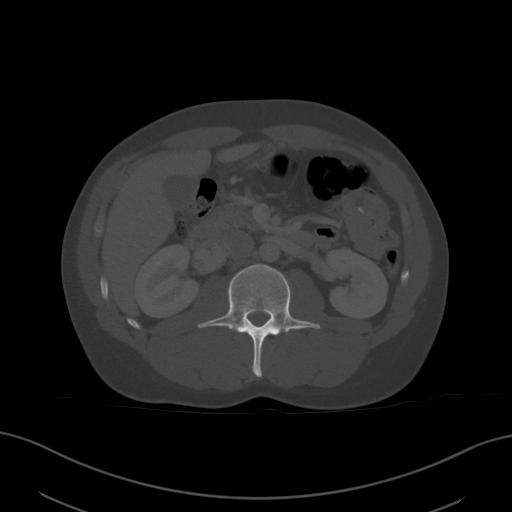
[im 66/91  soft-tissue]
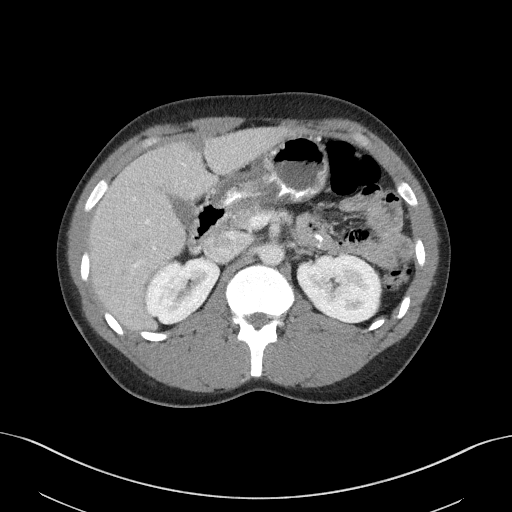
[im 71/91  soft-tissue]
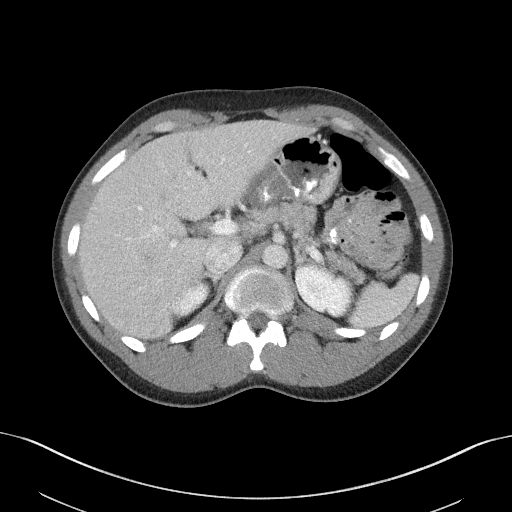
[im 81/91  soft-tissue]
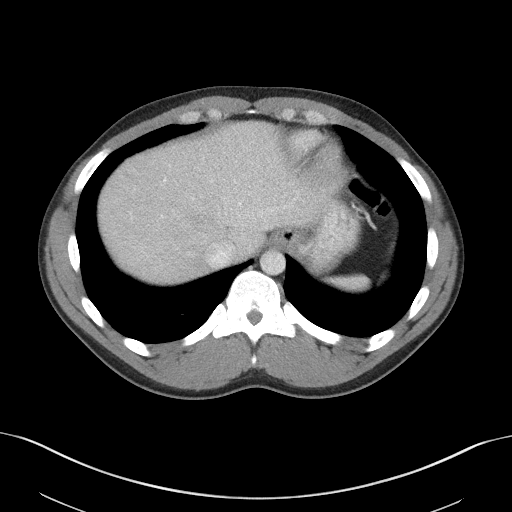
[im 86/91  soft-tissue]
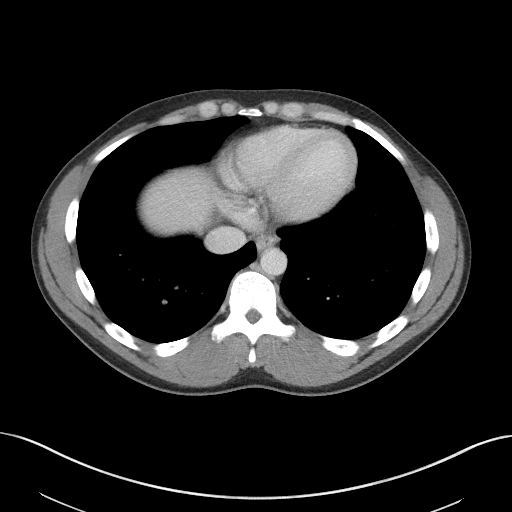

[Series 5: coronal st · coronal · 0.70mm/px · 3 of 97 slices shown]
[im 33/97  soft-tissue]
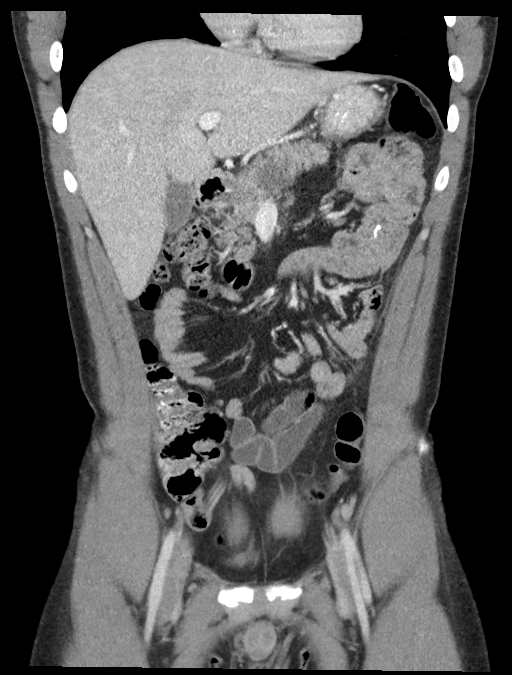
[im 43/97  soft-tissue]
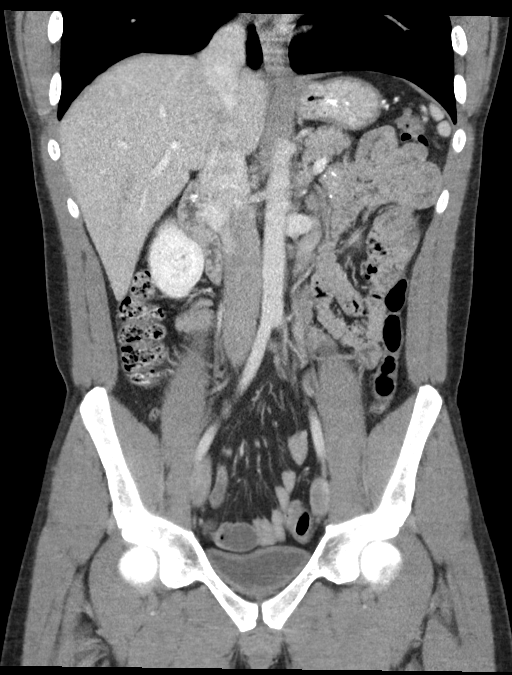
[im 54/97  soft-tissue]
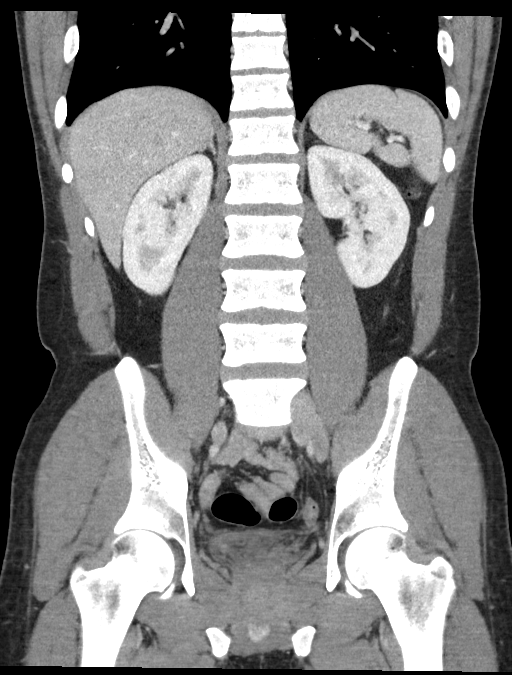

[16 of 46 positions shown; findings below may reference images not displayed]

FINDINGS: Lower chest: Lung bases are clear.

Hepatobiliary: There is fatty infiltration near the fissure for the
ligamentum teres. No other focal liver lesions are evident.
Gallbladder wall is not appreciably thickened. There is no biliary
duct dilatation.

Pancreas: There is no pancreatic mass or inflammatory focus.

Spleen: No splenic lesions are evident.

Adrenals/Urinary Tract: Adrenals bilaterally appear unremarkable.
Kidneys bilaterally show no evident mass or hydronephrosis on either
side. Mild fetal lobulation bilaterally is an anatomic variant.
There is no evident renal or ureteral calculus on either side.
Urinary bladder is midline with wall thickness upper normal.

Stomach/Bowel: There is no appreciable bowel wall or mesenteric
thickening. No evident bowel obstruction. Terminal ileum appears
normal. There is no evident free air or portal venous air.

Vascular/Lymphatic: No abdominal aortic aneurysm. No vascular
lesions are evident. There is no demonstrable adenopathy in the
abdomen or pelvis.

Reproductive: Prostate and seminal vesicles appear normal in size
and contour. No evident pelvic mass.

Other: Appendix appears normal. No abscess or ascites evident in the
abdomen or pelvis.

Musculoskeletal: There are no blastic or lytic bone lesions. No
intramuscular or abdominal wall lesions evident.
IMPRESSION: 1. Urinary bladder wall thickness is upper normal. Advise
correlation with urinalysis to assess for possible early cystitis.

2. No bowel obstruction. No abscess in the abdomen or pelvis.
Appendix appears normal.

3. No evident renal or ureteral calculus. No hydronephrosis on
either side.

## 2021-02-16 ENCOUNTER — Emergency Department (HOSPITAL_COMMUNITY)
Admission: EM | Admit: 2021-02-16 | Discharge: 2021-02-16 | Disposition: A | Discharge status: Home / Self Care | Payer: Commercial Managed Care - PPO | Attending: Emergency Medicine | Admitting: Emergency Medicine

## 2021-02-16 ENCOUNTER — Encounter (HOSPITAL_COMMUNITY): Payer: Self-pay

## 2021-02-16 ENCOUNTER — Other Ambulatory Visit: Payer: Self-pay

## 2021-02-16 DIAGNOSIS — R197 Diarrhea, unspecified: Secondary | ICD-10-CM

## 2021-02-16 DIAGNOSIS — Z20822 Contact with and (suspected) exposure to covid-19: Secondary | ICD-10-CM | POA: Insufficient documentation

## 2021-02-16 DIAGNOSIS — M791 Myalgia, unspecified site: Secondary | ICD-10-CM | POA: Insufficient documentation

## 2021-02-16 DIAGNOSIS — R1030 Lower abdominal pain, unspecified: Secondary | ICD-10-CM | POA: Insufficient documentation

## 2021-02-16 LAB — CBC WITH DIFFERENTIAL/PLATELET
Abs Immature Granulocytes: 0.03 10*3/uL (ref 0.00–0.07)
Basophils Absolute: 0.1 10*3/uL (ref 0.0–0.1)
Basophils Relative: 1 %
Eosinophils Absolute: 0.1 10*3/uL (ref 0.0–0.5)
Eosinophils Relative: 1 %
HCT: 44.2 % (ref 39.0–52.0)
Hemoglobin: 14.8 g/dL (ref 13.0–17.0)
Immature Granulocytes: 0 %
Lymphocytes Relative: 31 %
Lymphs Abs: 3 10*3/uL (ref 0.7–4.0)
MCH: 30.8 pg (ref 26.0–34.0)
MCHC: 33.5 g/dL (ref 30.0–36.0)
MCV: 91.9 fL (ref 80.0–100.0)
Monocytes Absolute: 0.7 10*3/uL (ref 0.1–1.0)
Monocytes Relative: 7 %
Neutro Abs: 5.7 10*3/uL (ref 1.7–7.7)
Neutrophils Relative %: 60 %
Platelets: 194 10*3/uL (ref 150–400)
RBC: 4.81 MIL/uL (ref 4.22–5.81)
RDW: 14.2 % (ref 11.5–15.5)
WBC: 9.6 10*3/uL (ref 4.0–10.5)
nRBC: 0 % (ref 0.0–0.2)

## 2021-02-16 LAB — COMPREHENSIVE METABOLIC PANEL
ALT: 25 U/L (ref 0–44)
AST: 28 U/L (ref 15–41)
Albumin: 4.1 g/dL (ref 3.5–5.0)
Alkaline Phosphatase: 65 U/L (ref 38–126)
Anion gap: 8 (ref 5–15)
BUN: 14 mg/dL (ref 6–20)
CO2: 27 mmol/L (ref 22–32)
Calcium: 9.3 mg/dL (ref 8.9–10.3)
Chloride: 104 mmol/L (ref 98–111)
Creatinine, Ser: 0.95 mg/dL (ref 0.61–1.24)
GFR, Estimated: >60 mL/min (ref >60)
Glucose, Bld: 107 mg/dL — ABNORMAL HIGH (ref 70–99)
Potassium: 4 mmol/L (ref 3.5–5.1)
Sodium: 139 mmol/L (ref 135–145)
Total Bilirubin: 0.8 mg/dL (ref 0.3–1.2)
Total Protein: 7.6 g/dL (ref 6.5–8.1)

## 2021-02-16 LAB — LIPASE, BLOOD: Lipase: 28 U/L (ref 11–51)

## 2021-02-16 LAB — RESP PANEL BY RT-PCR (FLU A&B, COVID) ARPGX2
Influenza A by PCR: NEGATIVE
Influenza B by PCR: NEGATIVE
SARS Coronavirus 2 by RT PCR: NEGATIVE

## 2021-02-16 MED ORDER — DICYCLOMINE HCL 20 MG PO TABS: 20.0000 mg | ORAL_TABLET | Freq: Two times a day (BID) | ORAL | 0 refills | Status: AC

## 2021-02-16 MED ORDER — DICYCLOMINE HCL 10 MG PO CAPS
10.0000 mg | ORAL_CAPSULE | Freq: Once | ORAL | Status: AC
  Administered 2021-02-16: 10 mg via ORAL
  Filled 2021-02-16: qty 1

## 2021-02-16 MED ORDER — SODIUM CHLORIDE 0.9 % IV BOLUS
1000.0000 mL | Freq: Once | INTRAVENOUS | Status: AC
  Administered 2021-02-16: 1000 mL via INTRAVENOUS

## 2021-02-16 NOTE — ED Provider Notes (Signed)
[facility] EMERGENCY DEPARTMENT Provider Note   CSN: [id number] Arrival date & time: 02/16/21  0941     History  Chief Complaint  Patient presents with   Abdominal Pain    William Bartlett is a 31 y.o. male.   Abdominal Pain  Patient presenting with lower abdominal pain.  Started 3 days ago, its intermittent last for about 20 seconds.  Having episodes of diarrhea for the last 3 days as well, 10 in total.  Denies any blood or mucus in the stool, no vomiting.  No sick contacts, no recent antibiotic use.  Unable to identify any provoking features other than the diarrhea, 20 minutes after episodes of diarrhea the abdominal pain resolves.  Denies any other medical problems, does not take medicine for anything.  Has not tried anything over-the-counter.    Also reports having body aches and feeling like his eyes were puffy this morning.  This has resolved.  Home Medications Prior to Admission medications   Medication Sig Start Date End Date Taking? Authorizing Provider  dicyclomine (BENTYL) 20 MG tablet Take 1 tablet (20 mg total) by mouth 3 (three) times daily as needed for spasms. 09/07/18   Long, Arlyss Repress, MD  ondansetron (ZOFRAN ODT) 4 MG disintegrating tablet Take 1 tablet (4 mg total) by mouth every 8 (eight) hours as needed. 09/07/18   Long, Arlyss Repress, MD  pantoprazole (PROTONIX) 40 MG tablet Take 1 tablet (40 mg total) by mouth daily. 09/07/18 10/07/18  Long, Arlyss Repress, MD  sucralfate (CARAFATE) 1 g tablet Take 1 tablet (1 g total) by mouth 4 (four) times daily -  with meals and at bedtime for 7 days. 09/07/18 09/14/18  Long, Arlyss Repress, MD      Allergies    Pineapple    Review of Systems   Review of Systems  Gastrointestinal:  Positive for abdominal pain.   Physical Exam Updated Vital Signs BP 126/87    Pulse 64    Temp 98 F (36.7 C) (Oral)    Resp 16    Ht 6\' 3"  (1.905 m)    Wt 81.6 kg    SpO2 100%    BMI 22.50 kg/m  Physical Exam Vitals and nursing note reviewed. Exam conducted  with a chaperone present.  Constitutional:      Appearance: Normal appearance.  HENT:     Head: Normocephalic and atraumatic.  Eyes:     General: No scleral icterus.       Right eye: No discharge.        Left eye: No discharge.     Extraocular Movements: Extraocular movements intact.     Pupils: Pupils are equal, round, and reactive to light.  Cardiovascular:     Rate and Rhythm: Normal rate and regular rhythm.     Pulses: Normal pulses.     Heart sounds: Normal heart sounds. No murmur heard.   No friction rub. No gallop.  Pulmonary:     Effort: Pulmonary effort is normal. No respiratory distress.     Breath sounds: Normal breath sounds.  Abdominal:     General: Abdomen is flat. Bowel sounds are normal. There is no distension.     Palpations: Abdomen is soft.     Tenderness: There is abdominal tenderness in the right lower quadrant, periumbilical area, suprapubic area and left lower quadrant. There is no guarding or rebound.  Skin:    General: Skin is warm and dry.     Coloration: Skin is not jaundiced.  Neurological:     Mental Status: He is alert. Mental status is at baseline.     Coordination: Coordination normal.    ED Results / Procedures / Treatments   Labs (all labs ordered are listed, but only abnormal results are displayed) Labs Reviewed  COMPREHENSIVE METABOLIC PANEL - Abnormal; Notable for the following components:      Result Value   Glucose, Bld 107 (*)    All other components within normal limits  RESP PANEL BY RT-PCR (FLU A&B, COVID) ARPGX2  CBC WITH DIFFERENTIAL/PLATELET  LIPASE, BLOOD    EKG None  Radiology No results found.  Procedures Procedures    Medications Ordered in ED Medications  sodium chloride 0.9 % bolus 1,000 mL (1,000 mLs Intravenous New Bag/Given 02/16/21 1007)  dicyclomine (BENTYL) capsule 10 mg (10 mg Oral Given 02/16/21 1002)    ED Course/ Medical Decision Making/ A&P                           Medical Decision  Making Amount and/or Complexity of Data Reviewed Labs: ordered.  Risk Prescription drug management.   This patient presents to the ED for concern of diarrhea and abdominal pain, this involves an extensive number of treatment options, and is a complaint that carries with it a high risk of complications and morbidity.  The differential diagnosis includes viral process, C. difficile, intra-abdominal process, other   Co morbidities that complicate the patient evaluation: none   Additional history obtained: -External records from outside source obtained and reviewed including: Chart review including previous notes, labs, imaging, consultation notes   Lab Tests: -I ordered, reviewed, and interpreted labs.  The pertinent results include: No leukocytosis or gross electrolyte derangement from fluid loss.  Lipase low, no AKI.   Medicines ordered and prescription drug management: -I ordered medication including Bentyl and fluids for fluid loss and abdominal pain -Reevaluation of the patient after these medicines showed that the patient improved -I have reviewed the patients home medicines and have made adjustments as needed   ED Course: Patient presenting with diarrhea and abdominal pain.  His vitals are stable, not febrile.  Abdomen is soft without peritoneal signs or guarding. I considered C. difficile but given no antibiotics use seems unlikely.  I doubt there is any acute intra-abdominal process given there is no focal tenderness, no guarding, no associated symptoms.  Additionally the work-up is benign.  Still serial abdominal exams have been benign.  His pain is resolved after Bentyl and fluids.  Do not feel he needs additional work-up.  Considered ordering CT imaging but I do not feel it would be beneficial given the lack of focal tenderness.  Discussed with patient, he is in agreement.   Reevaluation: After the interventions noted above, I reevaluated the patient and found that they  have :worsened   Dispostion: D/C         Final Clinical Impression(s) / ED Diagnoses Final diagnoses:  None    Rx / DC Orders ED Discharge Orders     None         Theron Arista, Cordelia Poche 02/16/21 1202    Gloris Manchester, MD 02/18/21 [phone]

## 2021-02-16 NOTE — Discharge Instructions (Addendum)
Eat a plain diet, drink primarily fluids and reintroduce foods into your diet slowly.  If you need you can take an Imodium for the diarrhea.  It is better to let the diarrhea run through your system as to remove any virus process that may be occurring.  Take Bentyl as needed for pain.  Return if the abdominal pain localizes or things worsen.

## 2021-02-16 NOTE — ED Triage Notes (Signed)
Patient complains of abd pain and diarrhea since fri and fever last night of 102.  Denies vomiting or blood in stool.  Also complaining of muscle pains and eyes swelling.

## 2021-10-22 ENCOUNTER — Emergency Department (HOSPITAL_COMMUNITY)
Admission: EM | Admit: 2021-10-22 | Discharge: 2021-10-22 | Disposition: A | Discharge status: Home / Self Care | Payer: Medicaid Other | Attending: Student | Admitting: Student

## 2021-10-22 ENCOUNTER — Other Ambulatory Visit: Payer: Self-pay

## 2021-10-22 ENCOUNTER — Encounter (HOSPITAL_COMMUNITY): Payer: Self-pay | Admitting: *Deleted

## 2021-10-22 DIAGNOSIS — J069 Acute upper respiratory infection, unspecified: Secondary | ICD-10-CM

## 2021-10-22 DIAGNOSIS — F17210 Nicotine dependence, cigarettes, uncomplicated: Secondary | ICD-10-CM | POA: Insufficient documentation

## 2021-10-22 DIAGNOSIS — Z20822 Contact with and (suspected) exposure to covid-19: Secondary | ICD-10-CM | POA: Insufficient documentation

## 2021-10-22 DIAGNOSIS — R6883 Chills (without fever): Secondary | ICD-10-CM | POA: Insufficient documentation

## 2021-10-22 DIAGNOSIS — R059 Cough, unspecified: Secondary | ICD-10-CM | POA: Insufficient documentation

## 2021-10-22 LAB — RESP PANEL BY RT-PCR (RSV, FLU A&B, COVID)  RVPGX2
Influenza A by PCR: NEGATIVE
Influenza B by PCR: NEGATIVE
Resp Syncytial Virus by PCR: NEGATIVE
SARS Coronavirus 2 by RT PCR: NEGATIVE

## 2021-10-22 NOTE — ED Provider Notes (Signed)
[facility] EMERGENCY DEPARTMENT Provider Note  CSN: [id number] Arrival date & time: 10/22/21 0901  Chief Complaint(s) Cough  HPI William Bartlett is a 31 y.o. male who presents emergency department for evaluation of cough and chills.  States that his symptoms have been present for 24 hours.  He arrives with 2 other patients who all had the same symptoms.  Denies chest pain, shortness of breath, abdominal pain, nausea, vomiting or other systemic symptoms.   Past Medical History History reviewed. No pertinent past medical history. There are no problems to display for this patient.  Home Medication(s) Prior to Admission medications   Medication Sig Start Date End Date Taking? Authorizing Provider  dicyclomine (BENTYL) 20 MG tablet Take 1 tablet (20 mg total) by mouth 2 (two) times daily. 02/16/21   Theron Arista, PA-C  ondansetron (ZOFRAN ODT) 4 MG disintegrating tablet Take 1 tablet (4 mg total) by mouth every 8 (eight) hours as needed. 09/07/18   Long, Arlyss Repress, MD  pantoprazole (PROTONIX) 40 MG tablet Take 1 tablet (40 mg total) by mouth daily. 09/07/18 10/07/18  Long, Arlyss Repress, MD  sucralfate (CARAFATE) 1 g tablet Take 1 tablet (1 g total) by mouth 4 (four) times daily -  with meals and at bedtime for 7 days. 09/07/18 09/14/18  Long, Arlyss Repress, MD                                                                                                                                    Past Surgical History History reviewed. No pertinent surgical history. Family History No family history on file.  Social History Social History   Tobacco Use   Smoking status: Every Day    Packs/day: 0.50    Years: 7.00    Total pack years: 3.50    Types: Cigarettes   Smokeless tobacco: Never  Vaping Use   Vaping Use: Never used  Substance Use Topics   Alcohol use: Yes    Alcohol/week: 12.0 standard drinks of alcohol    Types: 12 Cans of beer per week    Comment: 12 cans of beer and 1/5 of liquor on the  weekend   Drug use: Yes    Frequency: 7.0 times per week    Types: Marijuana    Comment: several times a day   Allergies Pineapple  Review of Systems Review of Systems  Constitutional:  Positive for chills.  Respiratory:  Positive for cough.     Physical Exam Vital Signs  I have reviewed the triage vital signs BP 129/87 (BP Location: Right Arm)   Pulse 80   Temp 97.9 F (36.6 C) (Oral)   Resp 16   Ht 6\' 3"  (1.905 m)   Wt 86.2 kg   SpO2 100%   BMI 23.75 kg/m   Physical Exam Constitutional:      General: He is not in acute distress.    Appearance: Normal appearance.  HENT:     Head: Normocephalic and atraumatic.     Nose: No congestion or rhinorrhea.  Eyes:     General:        Right eye: No discharge.        Left eye: No discharge.     Extraocular Movements: Extraocular movements intact.     Pupils: Pupils are equal, round, and reactive to light.  Cardiovascular:     Rate and Rhythm: Normal rate and regular rhythm.     Heart sounds: No murmur heard. Pulmonary:     Effort: No respiratory distress.     Breath sounds: No wheezing or rales.  Abdominal:     General: There is no distension.     Tenderness: There is no abdominal tenderness.  Musculoskeletal:        General: Normal range of motion.     Cervical back: Normal range of motion.  Skin:    General: Skin is warm and dry.  Neurological:     General: No focal deficit present.     Mental Status: He is alert.     ED Results and Treatments Labs (all labs ordered are listed, but only abnormal results are displayed) Labs Reviewed  RESP PANEL BY RT-PCR (RSV, FLU A&B, COVID)  RVPGX2                                                                                                                          Radiology No results found.  Pertinent labs & imaging results that were available during my care of the patient were reviewed by me and considered in my medical decision making (see MDM for  details).  Medications Ordered in ED Medications - No data to display                                                                                                                                   Procedures Procedures  (including critical care time)  Medical Decision Making / ED Course   This patient presents to the ED for concern of cough, this involves an extensive number of treatment options, and is a complaint that carries with it a high risk of complications and morbidity.  The differential diagnosis includes viral URI, COVID-19, influenza, RSV, pneumonia  MDM: Patient seen emergency room for evaluation of cough.  Physical exam is unremarkable.  COVID and flu, RSV testing all negative.  Patient hemodynamically stable here in the emergency department and with no documented fever, no hypoxia, low suspicion for pneumonia at this time.  He also arrives with 2 other family members with similar symptoms raising suspicion for viral illness.  Patient then discharged with return precautions of which he voiced understanding.   Additional history obtained: -Additional history obtained from wife -External records from outside source obtained and reviewed including: Chart review including previous notes, labs, imaging, consultation notes   Lab Tests: -I ordered, reviewed, and interpreted labs.   The pertinent results include:   Labs Reviewed  RESP PANEL BY RT-PCR (RSV, FLU A&B, COVID)  RVPGX2       Medicines ordered and prescription drug management: No orders of the defined types were placed in this encounter.   -I have reviewed the patients home medicines and have made adjustments as needed  Critical interventions none    Cardiac Monitoring: The patient was maintained on a cardiac monitor.  I personally viewed and interpreted the cardiac monitored which showed an underlying rhythm of: NSR  Social Determinants of Health:  Factors impacting patients care include:  none   Reevaluation: After the interventions noted above, I reevaluated the patient and found that they have :stayed the same  Co morbidities that complicate the patient evaluation History reviewed. No pertinent past medical history.    Dispostion: I considered admission for this patient, but he does not meet inpatient criteria for admission he is safe for discharge with outpatient follow-up     Final Clinical Impression(s) / ED Diagnoses Final diagnoses:  None     @PCDICTATION @    Teressa Lower, MD 10/22/21 2057

## 2021-10-22 NOTE — ED Triage Notes (Addendum)
Pt c/o dry cough x 2 days, headache that started yesterday. Denies known fever. Pt reports nasal congestion but also says it is no different than his normal. Sensitivity to light.

## 2023-05-10 ENCOUNTER — Other Ambulatory Visit: Payer: Self-pay

## 2023-05-10 ENCOUNTER — Emergency Department (HOSPITAL_COMMUNITY)
Admission: EM | Admit: 2023-05-10 | Discharge: 2023-05-10 | Disposition: A | Discharge status: Home / Self Care | Payer: Self-pay | Attending: Emergency Medicine | Admitting: Emergency Medicine

## 2023-05-10 ENCOUNTER — Emergency Department (HOSPITAL_COMMUNITY): Payer: Self-pay

## 2023-05-10 ENCOUNTER — Encounter (HOSPITAL_COMMUNITY): Payer: Self-pay

## 2023-05-10 DIAGNOSIS — R197 Diarrhea, unspecified: Secondary | ICD-10-CM | POA: Insufficient documentation

## 2023-05-10 DIAGNOSIS — E860 Dehydration: Secondary | ICD-10-CM

## 2023-05-10 DIAGNOSIS — R519 Headache, unspecified: Secondary | ICD-10-CM | POA: Insufficient documentation

## 2023-05-10 DIAGNOSIS — R112 Nausea with vomiting, unspecified: Secondary | ICD-10-CM

## 2023-05-10 LAB — COMPREHENSIVE METABOLIC PANEL WITH GFR
ALT: 24 U/L (ref 0–44)
AST: 30 U/L (ref 15–41)
Albumin: 4 g/dL (ref 3.5–5.0)
Alkaline Phosphatase: 60 U/L (ref 38–126)
Anion gap: 11 (ref 5–15)
BUN: 12 mg/dL (ref 6–20)
CO2: 26 mmol/L (ref 22–32)
Calcium: 9.7 mg/dL (ref 8.9–10.3)
Chloride: 102 mmol/L (ref 98–111)
Creatinine, Ser: 0.81 mg/dL (ref 0.61–1.24)
GFR, Estimated: >60 mL/min (ref >60)
Glucose, Bld: 123 mg/dL — ABNORMAL HIGH (ref 70–99)
Potassium: 3.4 mmol/L — ABNORMAL LOW (ref 3.5–5.1)
Sodium: 139 mmol/L (ref 135–145)
Total Bilirubin: 1.1 mg/dL (ref 0.0–1.2)
Total Protein: 7.4 g/dL (ref 6.5–8.1)

## 2023-05-10 LAB — RESP PANEL BY RT-PCR (RSV, FLU A&B, COVID)  RVPGX2
Influenza A by PCR: NEGATIVE
Influenza B by PCR: NEGATIVE
Resp Syncytial Virus by PCR: NEGATIVE
SARS Coronavirus 2 by RT PCR: NEGATIVE

## 2023-05-10 LAB — CBC WITH DIFFERENTIAL/PLATELET
Abs Immature Granulocytes: 0.03 10*3/uL (ref 0.00–0.07)
Basophils Absolute: 0.1 10*3/uL (ref 0.0–0.1)
Basophils Relative: 1 %
Eosinophils Absolute: 0 10*3/uL (ref 0.0–0.5)
Eosinophils Relative: 0 %
HCT: 39.4 % (ref 39.0–52.0)
Hemoglobin: 13.4 g/dL (ref 13.0–17.0)
Immature Granulocytes: 0 %
Lymphocytes Relative: 22 %
Lymphs Abs: 2.8 10*3/uL (ref 0.7–4.0)
MCH: 30.2 pg (ref 26.0–34.0)
MCHC: 34 g/dL (ref 30.0–36.0)
MCV: 88.7 fL (ref 80.0–100.0)
Monocytes Absolute: 0.8 10*3/uL (ref 0.1–1.0)
Monocytes Relative: 6 %
Neutro Abs: 9.3 10*3/uL — ABNORMAL HIGH (ref 1.7–7.7)
Neutrophils Relative %: 71 %
Platelets: 224 10*3/uL (ref 150–400)
RBC: 4.44 MIL/uL (ref 4.22–5.81)
RDW: 14.5 % (ref 11.5–15.5)
WBC: 12.9 10*3/uL — ABNORMAL HIGH (ref 4.0–10.5)
nRBC: 0 % (ref 0.0–0.2)

## 2023-05-10 MED ORDER — ONDANSETRON 4 MG PO TBDP
4.0000 mg | ORAL_TABLET | Freq: Three times a day (TID) | ORAL | 0 refills | Status: AC | PRN
Start: 2023-05-10 — End: ?

## 2023-05-10 MED ORDER — PROCHLORPERAZINE EDISYLATE 10 MG/2ML IJ SOLN
10.0000 mg | Freq: Once | INTRAMUSCULAR | Status: AC
Start: 2023-05-10 — End: 2023-05-10
  Administered 2023-05-10: 10 mg via INTRAVENOUS
  Filled 2023-05-10: qty 2

## 2023-05-10 MED ORDER — DEXAMETHASONE SODIUM PHOSPHATE 10 MG/ML IJ SOLN
10.0000 mg | Freq: Once | INTRAMUSCULAR | Status: AC
Start: 2023-05-10 — End: 2023-05-10
  Administered 2023-05-10: 10 mg via INTRAVENOUS
  Filled 2023-05-10: qty 1

## 2023-05-10 MED ORDER — KETOROLAC TROMETHAMINE 15 MG/ML IJ SOLN
15.0000 mg | Freq: Once | INTRAMUSCULAR | Status: AC
  Administered 2023-05-10: 15 mg via INTRAVENOUS
  Filled 2023-05-10: qty 1

## 2023-05-10 MED ORDER — SODIUM CHLORIDE 0.9 % IV BOLUS
1000.0000 mL | Freq: Once | INTRAVENOUS | Status: AC
Start: 2023-05-10 — End: 2023-05-10
  Administered 2023-05-10: 1000 mL via INTRAVENOUS

## 2023-05-10 MED ORDER — NAPROXEN 500 MG PO TABS: 500.0000 mg | ORAL_TABLET | Freq: Two times a day (BID) | ORAL | 0 refills | Status: AC

## 2023-05-10 MED ORDER — DIPHENHYDRAMINE HCL 50 MG/ML IJ SOLN
25.0000 mg | Freq: Once | INTRAMUSCULAR | Status: AC
  Administered 2023-05-10: 25 mg via INTRAVENOUS
  Filled 2023-05-10: qty 1

## 2023-05-10 MED ORDER — LACTATED RINGERS IV BOLUS
1000.0000 mL | Freq: Once | INTRAVENOUS | Status: AC
  Administered 2023-05-10: 1000 mL via INTRAVENOUS

## 2023-05-10 NOTE — ED Triage Notes (Signed)
 Pt arrived via POV c/o N/V/D X 2 days. Pt reports he has had 3 episodes of emesis today and no relief with OTC medications at home.

## 2023-05-10 NOTE — ED Provider Notes (Signed)
 [place] EMERGENCY DEPARTMENT AT [facility] Provider Note   CSN: [id number] Arrival date & time: 05/10/23  1247     History  Chief Complaint  Patient presents with   Emesis    William Bartlett is a 33 y.o. male.  Patient is a 33 year old male who presents to the emergency department with a chief complaint of headache, nausea, vomiting, diarrhea which has been ongoing for about the past 3 days.  Patient denies any known sick contacts.  He denies any cough, congestion, sore throat, rhinorrhea.  He denies any numbness, paresthesias or unilateral weakness.  He denies any thunderclap nature of the headache, syncope, recent falls or blunt head trauma, known history of bleeding disorders or anticoagulation use.  Patient notes that he has had no fever, chills.   Emesis Associated symptoms: diarrhea and headaches        Home Medications Prior to Admission medications   Medication Sig Start Date End Date Taking? Authorizing Provider  dicyclomine  (BENTYL ) 20 MG tablet Take 1 tablet (20 mg total) by mouth 2 (two) times daily. 02/16/21   Delray Fielding, PA-C  ondansetron  (ZOFRAN  ODT) 4 MG disintegrating tablet Take 1 tablet (4 mg total) by mouth every 8 (eight) hours as needed. 09/07/18   Long, Shereen Dike, MD  pantoprazole  (PROTONIX ) 40 MG tablet Take 1 tablet (40 mg total) by mouth daily. 09/07/18 10/07/18  Long, Shereen Dike, MD  sucralfate  (CARAFATE ) 1 g tablet Take 1 tablet (1 g total) by mouth 4 (four) times daily -  with meals and at bedtime for 7 days. 09/07/18 09/14/18  Long, Shereen Dike, MD      Allergies    Pineapple    Review of Systems   Review of Systems  Gastrointestinal:  Positive for diarrhea, nausea and vomiting.  Neurological:  Positive for headaches.  All other systems reviewed and are negative.   Physical Exam Updated Vital Signs BP (!) 115/52   Pulse 80   Temp 98 F (36.7 C) (Oral)   Resp 17   Ht 6\' 3"  (1.905 m)   Wt 86.2 kg   SpO2 99%   BMI 23.75 kg/m   Physical Exam Vitals and nursing note reviewed.  Constitutional:      Appearance: Normal appearance.  HENT:     Head: Normocephalic and atraumatic.     Nose: Nose normal.     Mouth/Throat:     Mouth: Mucous membranes are moist.  Eyes:     Extraocular Movements: Extraocular movements intact.     Conjunctiva/sclera: Conjunctivae normal.     Pupils: Pupils are equal, round, and reactive to light.  Cardiovascular:     Rate and Rhythm: Normal rate and regular rhythm.     Pulses: Normal pulses.     Heart sounds: Normal heart sounds. No murmur heard.    No gallop.  Pulmonary:     Effort: Pulmonary effort is normal. No respiratory distress.     Breath sounds: Normal breath sounds. No stridor. No wheezing, rhonchi or rales.  Abdominal:     General: Abdomen is flat. Bowel sounds are normal. There is no distension.     Palpations: Abdomen is soft.     Tenderness: There is no abdominal tenderness. There is no guarding.  Musculoskeletal:        General: Normal range of motion.     Cervical back: Normal range of motion and neck supple. No rigidity or tenderness.  Skin:    General: Skin is warm and  dry.     Findings: No rash.  Neurological:     General: No focal deficit present.     Mental Status: He is alert and oriented to person, place, and time. Mental status is at baseline.     Cranial Nerves: No cranial nerve deficit.     Sensory: No sensory deficit.     Motor: No weakness.     Coordination: Coordination normal.     Gait: Gait normal.  Psychiatric:        Mood and Affect: Mood normal.        Behavior: Behavior normal.        Thought Content: Thought content normal.        Judgment: Judgment normal.     ED Results / Procedures / Treatments   Labs (all labs ordered are listed, but only abnormal results are displayed) Labs Reviewed  CBC WITH DIFFERENTIAL/PLATELET - Abnormal; Notable for the following components:      Result Value   WBC 12.9 (*)    Neutro Abs 9.3 (*)     All other components within normal limits  COMPREHENSIVE METABOLIC PANEL WITH GFR - Abnormal; Notable for the following components:   Potassium 3.4 (*)    Glucose, Bld 123 (*)    All other components within normal limits  RESP PANEL BY RT-PCR (RSV, FLU A&B, COVID)  RVPGX2    EKG None  Radiology CT Head Wo Contrast Result Date: 05/10/2023 CLINICAL DATA:  Headache, sudden, severe.  Nausea and vomiting. EXAM: CT HEAD WITHOUT CONTRAST TECHNIQUE: Contiguous axial images were obtained from the base of the skull through the vertex without intravenous contrast. RADIATION DOSE REDUCTION: This exam was performed according to the departmental dose-optimization program which includes automated exposure control, adjustment of the mA and/or kV according to patient size and/or use of iterative reconstruction technique. COMPARISON:  Head MRI 02/25/2007 and head CT 02/18/2007 FINDINGS: Brain: There is no evidence of an acute infarct, intracranial hemorrhage, mass, midline shift, or extra-axial fluid collection. Cerebral volume is normal. The ventricles are normal in size. Vascular: No hyperdense vessel. Skull: No fracture or suspicious lesion. Sinuses/Orbits: Visualized paranasal sinuses and mastoid air cells are clear. Unremarkable orbits. Other: None. IMPRESSION: Negative head CT. Electronically Signed   By: Aundra Lee M.D.   On: 05/10/2023 14:35    Procedures Procedures    Medications Ordered in ED Medications  sodium chloride  0.9 % bolus 1,000 mL (0 mLs Intravenous Stopped 05/10/23 1450)  prochlorperazine (COMPAZINE) injection 10 mg (10 mg Intravenous Given 05/10/23 1338)  diphenhydrAMINE (BENADRYL) injection 25 mg (25 mg Intravenous Given 05/10/23 1336)  dexamethasone (DECADRON) injection 10 mg (10 mg Intravenous Given 05/10/23 1339)  ketorolac  (TORADOL ) 15 MG/ML injection 15 mg (15 mg Intravenous Given 05/10/23 1447)  lactated ringers bolus 1,000 mL (1,000 mLs Intravenous New Bag/Given 05/10/23 1451)     ED Course/ Medical Decision Making/ A&P                                 Medical Decision Making Amount and/or Complexity of Data Reviewed Labs: ordered. Radiology: ordered.  Risk Prescription drug management.   This patient presents to the ED for concern of nausea, vomiting, diarrhea, headache differential diagnosis includes acute viral syndrome, gastroenteritis, acute appendicitis, cholecystitis, obstruction, diverticulitis, testicular torsion, pyelonephritis, kidney stone, subarachnoid hemorrhage, meningitis    Additional history obtained:  Additional history obtained from none External records from outside source obtained  and reviewed including none   Lab Tests:  I Ordered, and personally interpreted labs.  The pertinent results include: Mild leukocytosis, no anemia, normal kidney function liver function, normal electrolytes, negative viral swab   Imaging Studies ordered:  I ordered imaging studies including CT scan of the head I independently visualized and interpreted imaging which showed no acute intracranial hemorrhage or other acute process I agree with the radiologist interpretation   Medicines ordered and prescription drug management:  I ordered medication including Decadron, Toradol , Compazine, Benadryl, IV fluids for dehydration and headache Reevaluation of the patient after these medicines showed that the patient improved I have reviewed the patients home medicines and have made adjustments as needed   Problem List / ED Course:  Patient symptoms have greatly improved with treatment in the emergency department.  Patient does note that headache is almost completely resolved.  He has been able to tolerate p.o. intake without difficulty and has had no further nausea or vomiting.  Patient's blood work has been overall unremarkable and CT scan of the head demonstrated no signs of acute process.  Do not suspect etiology such as subarachnoid hemorrhage,  meningitis.  Patient is otherwise low risk via his [facility] rule. patient has no nuchal rigidity or pain in his neck.  Patient has no associated abnormal rashes.  Abdominal exam is benign with no focal tenderness throughout.  Do not suspect acute appendicitis, cholecystitis, small bowel obstruction, diverticulitis, pyelonephritis, kidney stone, pancreatitis.  Suspect an acute viral syndrome at this point.  Will continue symptomatic treatment on an outpatient basis.  Strict return precautions were discussed for any new or worsening symptoms.  Patient voiced understanding and had no additional questions.   Social Determinants of Health:  None           Final Clinical Impression(s) / ED Diagnoses Final diagnoses:  None    Rx / DC Orders ED Discharge Orders     None         Emmalene Hare 05/10/23 1527    Dorenda Gandy, MD 05/11/23 1259

## 2023-05-10 NOTE — Discharge Instructions (Signed)

## 2024-01-20 ENCOUNTER — Encounter (HOSPITAL_COMMUNITY): Payer: Self-pay | Admitting: Emergency Medicine

## 2024-01-20 ENCOUNTER — Emergency Department (HOSPITAL_COMMUNITY)
Admission: EM | Admit: 2024-01-20 | Discharge: 2024-01-20 | Disposition: A | Discharge status: Home / Self Care | Payer: Self-pay | Attending: Emergency Medicine | Admitting: Emergency Medicine

## 2024-01-20 ENCOUNTER — Other Ambulatory Visit: Payer: Self-pay

## 2024-01-20 DIAGNOSIS — R000 Tachycardia, unspecified: Secondary | ICD-10-CM | POA: Insufficient documentation

## 2024-01-20 DIAGNOSIS — K047 Periapical abscess without sinus: Secondary | ICD-10-CM | POA: Insufficient documentation

## 2024-01-20 HISTORY — DX: Unspecified firearm discharge, undetermined intent, initial encounter: Y24.9XXA

## 2024-01-20 HISTORY — DX: Guillain-Barre syndrome: G61.0

## 2024-01-20 MED ORDER — ONDANSETRON HCL 4 MG PO TABS: 4.0000 mg | ORAL_TABLET | Freq: Four times a day (QID) | ORAL | 0 refills | Status: AC

## 2024-01-20 MED ORDER — HYDROCODONE-ACETAMINOPHEN 5-325 MG PO TABS: 1.0000 | ORAL_TABLET | Freq: Four times a day (QID) | ORAL | 0 refills | Status: AC | PRN

## 2024-01-20 MED ORDER — AMOXICILLIN 500 MG PO CAPS: 500.0000 mg | ORAL_CAPSULE | Freq: Three times a day (TID) | ORAL | 0 refills | Status: AC

## 2024-01-20 NOTE — Discharge Instructions (Signed)
 Complete your entire course of antibiotics as prescribed.  You  may use the hydrocodone  for pain relief but do not drive within 4 hours of taking as this will make you drowsy.  You may also continue using ibuprofen  but hold any additional Tylenol  as the prescription contains some Tylenol  and it.  Avoid applying heat or ice to this abscess area which can worsen your symptoms.  You may use warm salt water swish and spit treatment or half peroxide and water swish and spit after meals to keep this area clean as discussed.  Get rechecked for any worsening symptoms, expect significant improvement began within 48 hours, if this does not occur or if your symptoms worsen return for recheck.  You have received some suggestions for dental follow-up care.

## 2024-01-20 NOTE — ED Triage Notes (Signed)
 Pt c/o right side dental pain x 3 days. Has been taking tylenol  with no relief. Woke with swelling this am. No obvious swelling noted.

## 2024-01-20 NOTE — ED Provider Notes (Signed)
 " [facility] EM[facility]Y DE[facility]MENT AT [facility] Provider Note   CSN: [id number] Arrival date & time: 01/20/24  1300     Patient presents with: Dental Pain   William Bartlett is a 34 y.o. male with no significant past medical history, has a known fractured tooth right lower third molar which has been broken for some time, developed pain and swelling 3 days ago.  He endorses severe pain which is mildly improved after doses of ibuprofen  and Tylenol .  He denies fevers or chills, he has had some nausea and vomited once which he states is typical reaction when he is in severe pain.  He has been no drainage from the site.  He denies any difficulty swallowing.   The history is provided by the patient.  Dental Pain Associated symptoms: no facial swelling, no fever and no neck pain        Prior to Admission medications  Medication Sig Start Date End Date Taking? Authorizing Provider  amoxicillin  (AMOXIL ) 500 MG capsule Take 1 capsule (500 mg total) by mouth 3 (three) times daily. 01/20/24  Yes Tynlee Bayle, PA-C  HYDROcodone -acetaminophen  (NORCO/VICODIN) 5-325 MG tablet Take 1 tablet by mouth every 6 (six) hours as needed for severe pain (pain score 7-10). 01/20/24  Yes Lindia Garms, PA-C  ondansetron  (ZOFRAN ) 4 MG tablet Take 1 tablet (4 mg total) by mouth every 6 (six) hours. 01/20/24  Yes Smitty Ackerley, PA-C  dicyclomine  (BENTYL ) 20 MG tablet Take 1 tablet (20 mg total) by mouth 2 (two) times daily. 02/16/21   Emelia Sluder, PA-C  naproxen  (NAPROSYN ) 500 MG tablet Take 1 tablet (500 mg total) by mouth 2 (two) times daily. 05/10/23   Daralene Lonni BIRCH, PA-C  ondansetron  (ZOFRAN -ODT) 4 MG disintegrating tablet Take 1 tablet (4 mg total) by mouth every 8 (eight) hours as needed for nausea or vomiting. 05/10/23   Daralene Lonni BIRCH, PA-C  pantoprazole  (PROTONIX ) 40 MG tablet Take 1 tablet (40 mg total) by mouth daily. 09/07/18 10/07/18  Long, Fonda MATSU, MD  sucralfate  (CARAFATE ) 1 g tablet Take 1  tablet (1 g total) by mouth 4 (four) times daily -  with meals and at bedtime for 7 days. 09/07/18 09/14/18  Long, Fonda MATSU, MD    Allergies: Pineapple    Review of Systems  Constitutional:  Negative for fever.  HENT:  Positive for dental problem. Negative for facial swelling and sore throat.   Respiratory:  Negative for shortness of breath.   Gastrointestinal:  Positive for nausea and vomiting.  Musculoskeletal:  Negative for neck pain and neck stiffness.    Updated Vital Signs BP (!) 143/99 (BP Location: Right Arm)   Pulse (!) 106   Temp 98.9 F (37.2 C) (Oral)   Resp 18   SpO2 100%   Physical Exam Constitutional:      General: He is not in acute distress.    Appearance: He is well-developed.  HENT:     Head: Normocephalic and atraumatic.     Jaw: No trismus.     Right Ear: Tympanic membrane and external ear normal.     Left Ear: Tympanic membrane and external ear normal.     Mouth/Throat:     Mouth: Mucous membranes are moist. No oral lesions.     Dentition: Dental tenderness and dental abscesses present.     Pharynx: Oropharynx is clear. Uvula midline. No posterior oropharyngeal erythema.     Tonsils: No tonsillar abscesses.     Comments: Fracture  to the gumline of the right lower third molar.  There is surrounding gingival edema, soft edema of his right skin along his jawline, no induration or obvious erythema.  His right submandibular node is tender.  No sublingual induration. Eyes:     Conjunctiva/sclera: Conjunctivae normal.  Cardiovascular:     Rate and Rhythm: Tachycardia present.     Heart sounds: Normal heart sounds.     Comments: Borderline tachycardic with rate of 106 Pulmonary:     Effort: Pulmonary effort is normal.  Musculoskeletal:        General: Normal range of motion.     Cervical back: Normal range of motion and neck supple.  Lymphadenopathy:     Cervical: No cervical adenopathy.  Skin:    General: Skin is warm and dry.     Findings: No erythema.   Neurological:     Mental Status: He is alert and oriented to person, place, and time.     (all labs ordered are listed, but only abnormal results are displayed) Labs Reviewed - No data to display  EKG: None  Radiology: No results found.   Procedures   Medications Ordered in the ED - No data to display                                  Medical Decision Making Patient was of right lower molar chronic fracture with pain and swelling consistent with dental infection/abscess although there is no identifiable pocket that would be amenable to I&D.  Sublingual space is soft, no evidence for Ludwig's angina, uvula midline.  Patient was started on amoxicillin , pain treated with hydrocodone , Zofran  also given.  Return precautions outlined, dental referrals given  Risk Prescription drug management.        Final diagnoses:  Dental infection    ED Discharge Orders          Ordered    HYDROcodone -acetaminophen  (NORCO/VICODIN) 5-325 MG tablet  Every 6 hours PRN        01/20/24 1538    ondansetron  (ZOFRAN ) 4 MG tablet  Every 6 hours        01/20/24 1538    amoxicillin  (AMOXIL ) 500 MG capsule  3 times daily        01/20/24 1538               Vitaliy Eisenhour, PA-C 01/20/24 1544    Yolande Lamar BROCKS, MD 01/21/24 1806  "
